# Patient Record
Sex: Female | Born: 1980 | Hispanic: Yes | Marital: Married | Smoking: Former smoker
Health system: Southern US, Community
[De-identification: ages and names within clinical notes are randomized; demographics above are authoritative.]

## PROBLEM LIST (undated history)

## (undated) DIAGNOSIS — F32A Depression, unspecified: Secondary | ICD-10-CM

## (undated) DIAGNOSIS — F329 Major depressive disorder, single episode, unspecified: Secondary | ICD-10-CM

## (undated) DIAGNOSIS — R51 Headache: Secondary | ICD-10-CM

## (undated) DIAGNOSIS — G709 Myoneural disorder, unspecified: Secondary | ICD-10-CM

## (undated) DIAGNOSIS — K219 Gastro-esophageal reflux disease without esophagitis: Secondary | ICD-10-CM

---

## 2008-03-25 HISTORY — PX: CHOLECYSTECTOMY: SHX55

## 2008-06-07 ENCOUNTER — Observation Stay: Payer: Self-pay

## 2008-06-07 ENCOUNTER — Emergency Department: Payer: Self-pay | Admitting: Internal Medicine

## 2008-06-10 ENCOUNTER — Observation Stay: Payer: Self-pay

## 2008-06-13 ENCOUNTER — Observation Stay: Payer: Self-pay | Admitting: Obstetrics and Gynecology

## 2008-06-16 ENCOUNTER — Inpatient Hospital Stay: Payer: Self-pay | Admitting: Obstetrics and Gynecology

## 2008-06-20 ENCOUNTER — Emergency Department: Payer: Self-pay | Admitting: Unknown Physician Specialty

## 2008-07-11 ENCOUNTER — Emergency Department: Payer: Self-pay | Admitting: Emergency Medicine

## 2008-08-09 ENCOUNTER — Ambulatory Visit: Payer: Self-pay | Admitting: Surgery

## 2008-08-12 ENCOUNTER — Ambulatory Visit: Payer: Self-pay | Admitting: Surgery

## 2009-11-28 IMAGING — US ABDOMEN ULTRASOUND LIMITED
1 series · 17 of 25 positions shown · non-contrast
Comparison: none

REASON FOR EXAM: abd pain
COMMENTS:

[Series 1: abdomen ultrasound limited · 17 of 32 slices shown]
[im 1/32]
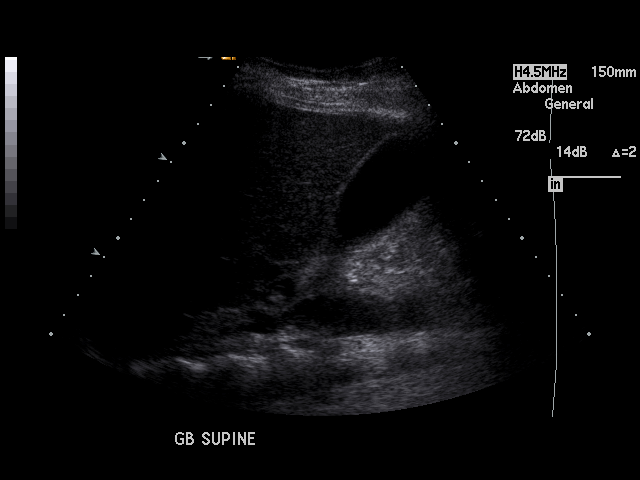
[im 3/32]
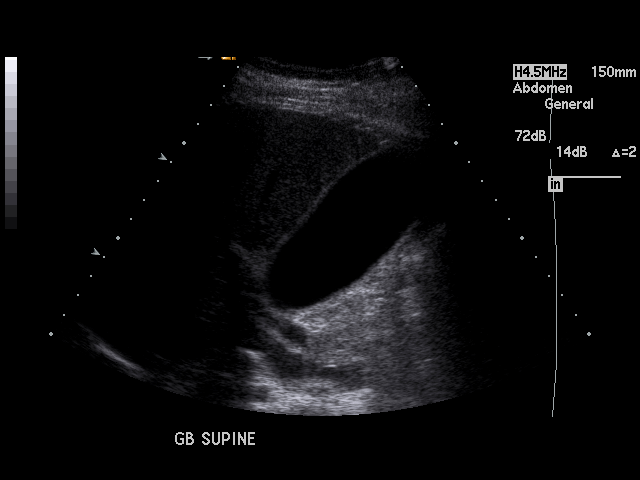
[im 4/32]
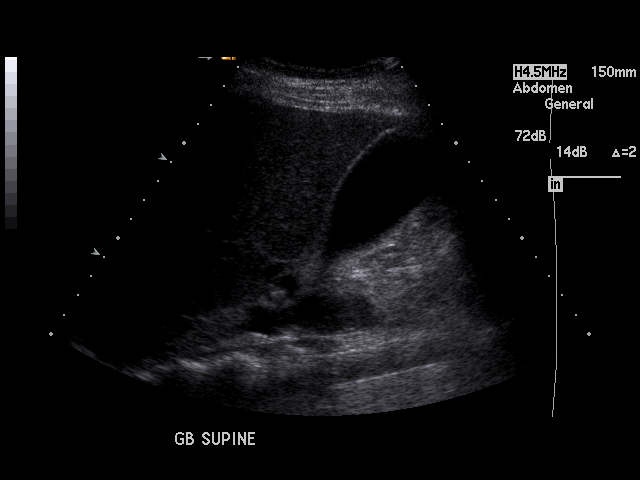
[im 7/32]
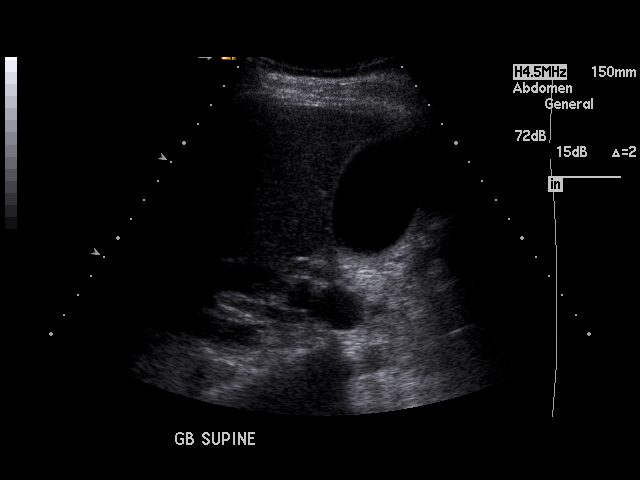
[im 8/32]
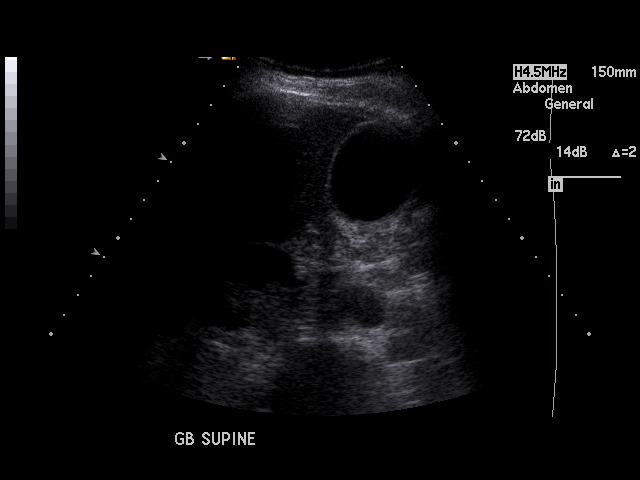
[im 11/32]
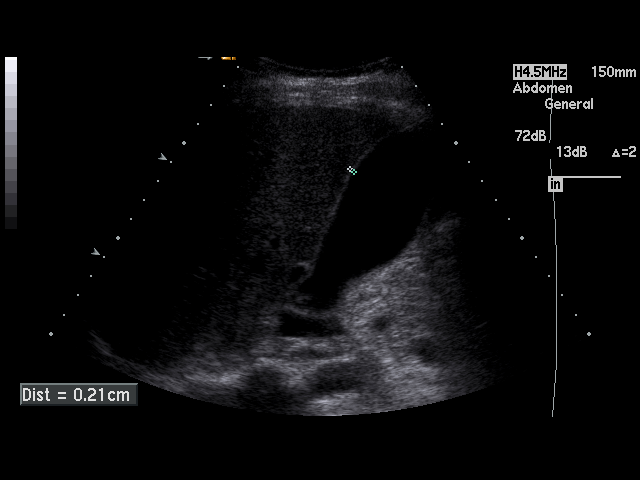
[im 12/32]
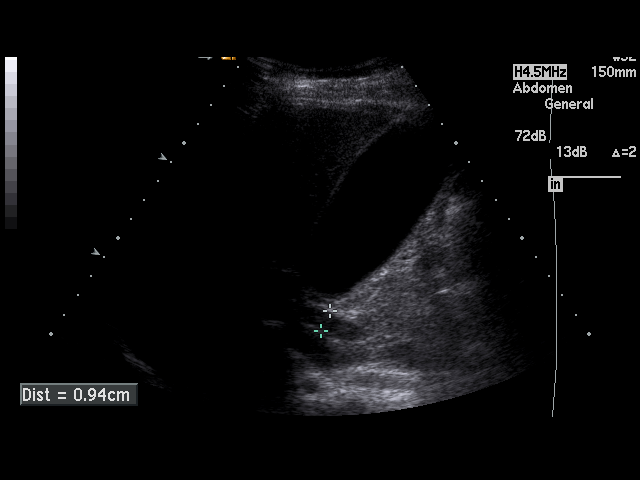
[im 15/32]
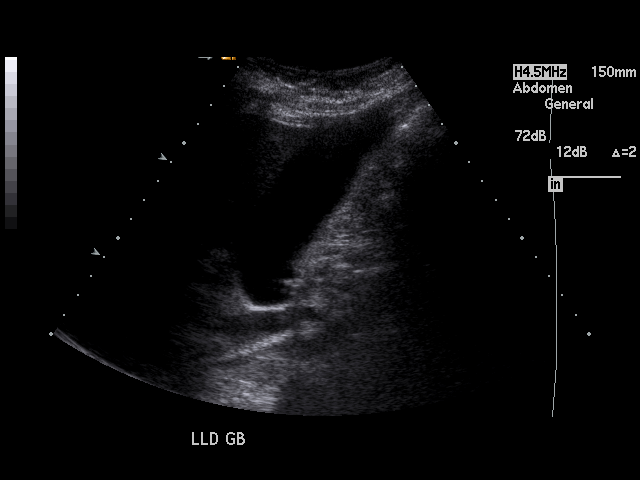
[im 16/32]
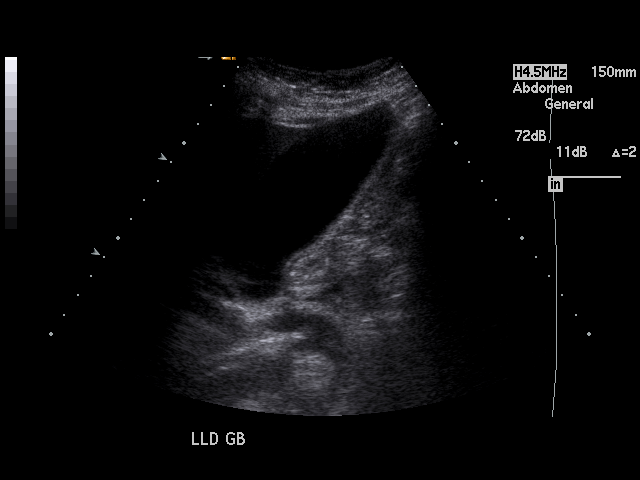
[im 17/32]
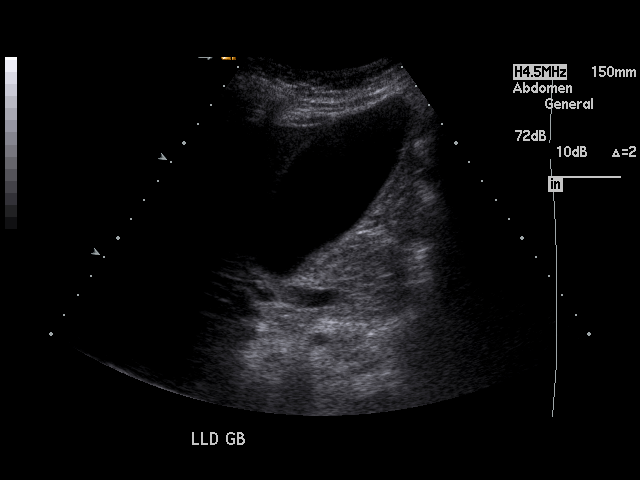
[im 20/32]
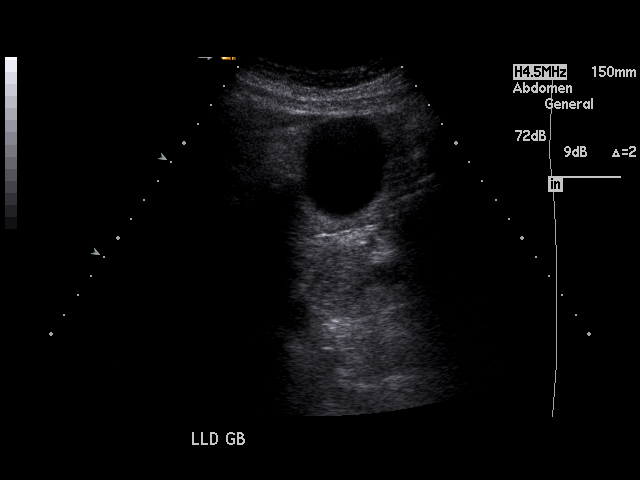
[im 21/32]
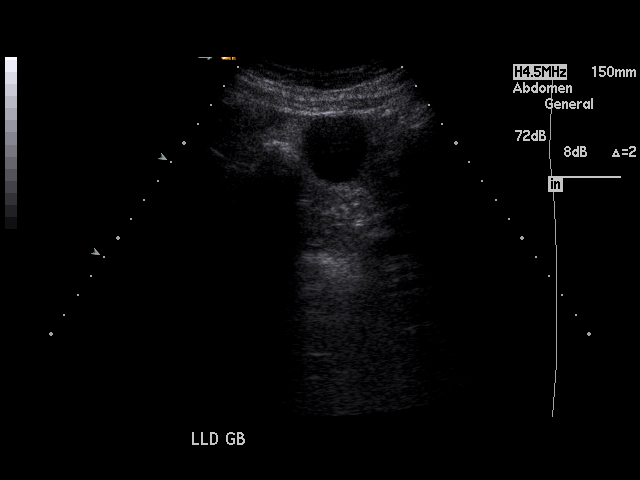
[im 24/32]
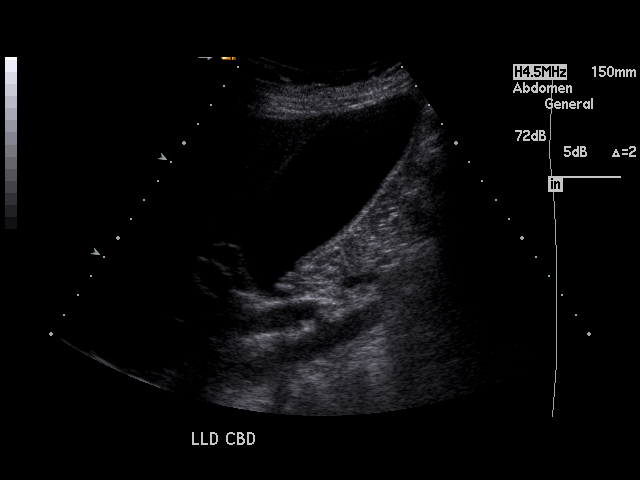
[im 25/32]
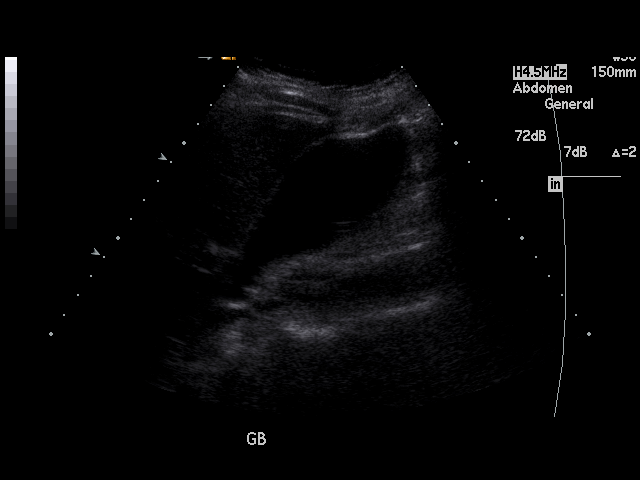
[im 28/32]
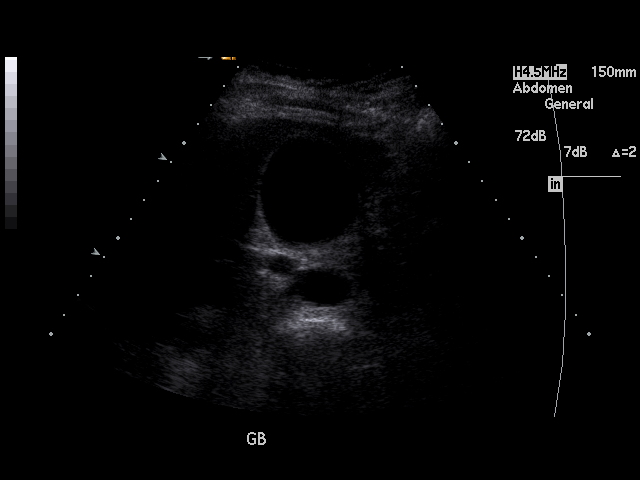
[im 29/32]
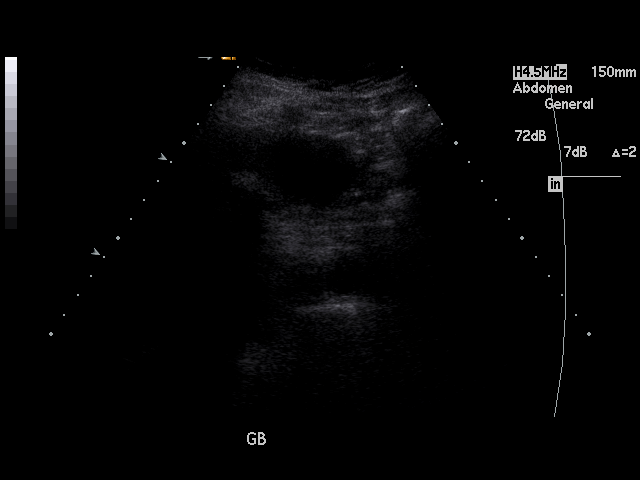
[im 32/32]
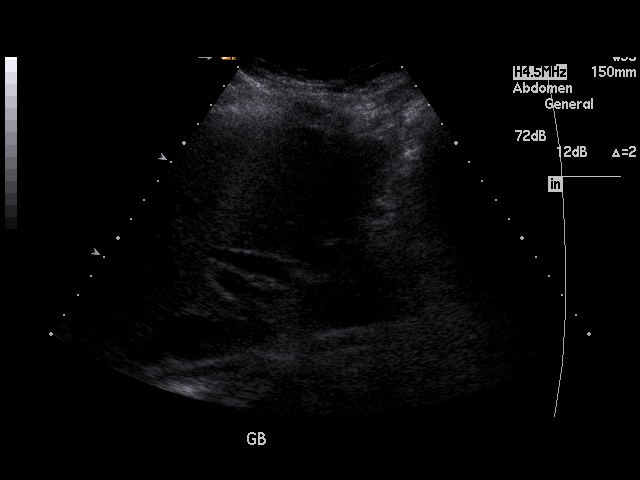

[17 of 25 positions shown; findings below may reference images not displayed]

PROCEDURE:     US  - US ABDOMEN LIMITED SURVEY  - June 20, 2008  [DATE]

RESULT:     The liver is grossly unremarkable. Evaluation of the gallbladder
demonstrates mobile gallstones with acoustic shadowing.  The gallbladder
wall measures 2.1 mm in diameter. There is no evidence of pericholecystic
fluid. There is no evidence of a sonographic Murphy's sign. The common bile
duct measures 9.4 mm in diameter.
IMPRESSION: 1.     No sonographic evidence of acute cholecystitis. The dilated common
bile duct may be secondary to a calculus previously within the biliary
system which has subsequently passed. There is no sonographic evidence of a
calculus in the present study. Mobile gallstones are identified within the
gallbladder.
2.     Dr. Syverson of the Emergency Department was informed of these findings
at the time of the initial interpretation.

## 2013-09-07 ENCOUNTER — Other Ambulatory Visit: Payer: Self-pay | Admitting: Neurosurgery

## 2013-09-08 ENCOUNTER — Encounter (HOSPITAL_COMMUNITY): Payer: Self-pay

## 2013-09-08 ENCOUNTER — Encounter (HOSPITAL_COMMUNITY)
Admission: RE | Admit: 2013-09-08 | Discharge: 2013-09-08 | Disposition: A | Discharge status: Home / Self Care | Payer: Worker's Compensation | Source: Ambulatory Visit | Attending: Neurosurgery | Admitting: Neurosurgery

## 2013-09-08 HISTORY — DX: Myoneural disorder, unspecified: G70.9

## 2013-09-08 HISTORY — DX: Major depressive disorder, single episode, unspecified: F32.9

## 2013-09-08 HISTORY — DX: Gastro-esophageal reflux disease without esophagitis: K21.9

## 2013-09-08 HISTORY — DX: Headache: R51

## 2013-09-08 HISTORY — DX: Depression, unspecified: F32.A

## 2013-09-08 LAB — SURGICAL PCR SCREEN
MRSA, PCR: NEGATIVE
STAPHYLOCOCCUS AUREUS: NEGATIVE

## 2013-09-08 LAB — BASIC METABOLIC PANEL
BUN: 10 mg/dL (ref 6–23)
CO2: 24 mEq/L (ref 19–32)
Calcium: 9.4 mg/dL (ref 8.4–10.5)
Chloride: 103 mEq/L (ref 96–112)
Creatinine, Ser: 0.44 mg/dL — ABNORMAL LOW (ref 0.50–1.10)
GFR calc Af Amer: >90 mL/min (ref >90)
Glucose, Bld: 102 mg/dL — ABNORMAL HIGH (ref 70–99)
POTASSIUM: 4.3 meq/L (ref 3.7–5.3)
SODIUM: 140 meq/L (ref 137–147)

## 2013-09-08 LAB — CBC
HCT: 40.9 % (ref 36.0–46.0)
Hemoglobin: 14.2 g/dL (ref 12.0–15.0)
MCH: 32.6 pg (ref 26.0–34.0)
MCHC: 34.7 g/dL (ref 30.0–36.0)
MCV: 93.8 fL (ref 78.0–100.0)
Platelets: 248 10*3/uL (ref 150–400)
RBC: 4.36 MIL/uL (ref 3.87–5.11)
RDW: 12 % (ref 11.5–15.5)
WBC: 7.4 10*3/uL (ref 4.0–10.5)

## 2013-09-08 LAB — HCG, SERUM, QUALITATIVE: PREG SERUM: NEGATIVE

## 2013-09-08 NOTE — Progress Notes (Signed)
Call to Darl PikesSusan, at Dr. Sueanne Margaritaabbell's office, she confirms surgery is on 09/10/2013, she also reports the pt. Was called & t she & the family are aware of the date & time for arrival. Call will be made to language resources to schedule interpretor.

## 2013-09-08 NOTE — Pre-Procedure Instructions (Addendum)
Tiffany Castillo  09/08/2013   Your procedure is scheduled on: Thursday, September 09, 2013  Report to [facility] (use Main Entrance "A'') at 10:00a.m. ( per MD)  Call this number if you have problems the morning of surgery: [phone]   Remember:   Do not eat food or drink liquids after midnight.   Take these medicines the morning of surgery with A SIP OF WATER: pain medicine is OK if she needs to take the morning of surgery   Stop taking Aspirin, Coumadin, Plavix, Effient, and herbal medications. Do not take any NSAIDs ie: Ibuprofen, Advil, Naproxen or any medication containing Aspirin.   Do not wear jewelry, make-up or nail polish.    Do not wear lotions, powders, or perfumes. You may wear deodorant.   Do not shave 48 hours prior to surgery.   Do not bring valuables to the hospital.  [facility] is not responsible for any belongings or valuables.                Contacts, dentures or bridgework may not be worn into surgery.   Leave suitcase in the car. After surgery it may be brought to your room.   For patients admitted to the hospital, discharge time is determined by your  treatment team.               Patients discharged the day of surgery will not be allowed to drive home.   Name and phone number of your driver: with husband   Special Instructions:  Special Instructions:Special Instructions: [place] - Preparing for Surgery  Before surgery, you can play an important role.  Because skin is not sterile, your skin needs to be as free of germs as possible.  You can reduce the number of germs on you skin by washing with CHG (chlorahexidine gluconate) soap before surgery.  CHG is an antiseptic cleaner which kills germs and bonds with the skin to continue killing germs even after washing.  Please DO NOT use if you have an allergy to CHG or antibacterial soaps.  If your skin becomes reddened/irritated stop using the CHG and inform your nurse when you arrive at  Short Stay.  Do not shave (including legs and underarms) for at least 48 hours prior to the first CHG shower.  You may shave your face.  Please follow these instructions carefully:   1.  Shower with CHG Soap the night before surgery and the morning of Surgery.  2.  If you choose to wash your hair, wash your hair first as usual with your normal shampoo.  3.  After you shampoo, rinse your hair and body thoroughly to remove the Shampoo.  4.  Use CHG as you would any other liquid soap.  You can apply chg directly  to the skin and wash gently with scrungie or a clean washcloth.  5.  Apply the CHG Soap to your body ONLY FROM THE NECK DOWN.  Do not use on open wounds or open sores.  Avoid contact with your eyes, ears, mouth and genitals (private parts).  Wash genitals (private parts) with your normal soap.  6.  Wash thoroughly, paying special attention to the area where your surgery will be performed.  7.  Thoroughly rinse your body with warm water from the neck down.  8.  DO NOT shower/wash with your normal soap after using and rinsing off the CHG Soap.  9.  Pat yourself dry with a clean towel.  10.  Wear clean pajamas.            11.  Place clean sheets on your bed the night of your first shower and do not sleep with pets.  Day of Surgery  Do not apply any lotions the morning of surgery.  Please wear clean clothes to the hospital/surgery center.   Please read over the following fact sheets that you were given: Pain Booklet, Coughing and Deep Breathing, MRSA Information and Surgical Site Infection Prevention

## 2013-09-08 NOTE — Progress Notes (Signed)
Calls 2 x's to Dr. Sueanne Margaritaabbell's office, spoke with Roxy HorsemanJessica, Susan out of office.  Surg. Date is in question, family & pt. Told that its 09/09/2013, chart says 09/10/2013. Spouse of pt. Will follow up with call to Dr. Sueanne Margaritaabbell's office Darl Pikes(Susan) this afternoon to get clarified. Language resources being used today for PAT visit, Dorene Grebeatalie. Spouse of pt. Speaks English, pt. Mostly spanish speaking. Pt. Denies pain in chest, no other symptoms of cardiac disease, no cardiac testing in her past. Pt. Had some PCP care in past- recent perhaps, but unsure where, [place] somewhere. Pt. Reports that she had implant in arm for Birth control, but expired since 03/2013. No menses in 3 yrs. Plus.

## 2013-09-09 MED ORDER — CEFAZOLIN SODIUM-DEXTROSE 2-3 GM-% IV SOLR
2.0000 g | INTRAVENOUS | Status: AC
  Administered 2013-09-10: 2 g via INTRAVENOUS
  Filled 2013-09-09: qty 50

## 2013-09-10 ENCOUNTER — Encounter (HOSPITAL_COMMUNITY): Payer: Self-pay | Admitting: Certified Registered"

## 2013-09-10 ENCOUNTER — Ambulatory Visit (HOSPITAL_COMMUNITY)
Admission: RE | Admit: 2013-09-10 | Discharge: 2013-09-11 | Disposition: A | Discharge status: Home / Self Care | Payer: Worker's Compensation | Source: Ambulatory Visit | Attending: Neurosurgery | Admitting: Neurosurgery

## 2013-09-10 ENCOUNTER — Ambulatory Visit (HOSPITAL_COMMUNITY): Payer: Worker's Compensation

## 2013-09-10 ENCOUNTER — Encounter (HOSPITAL_COMMUNITY): Payer: Worker's Compensation | Admitting: Certified Registered Nurse Anesthetist

## 2013-09-10 ENCOUNTER — Ambulatory Visit (HOSPITAL_COMMUNITY): Payer: Worker's Compensation | Admitting: Certified Registered Nurse Anesthetist

## 2013-09-10 ENCOUNTER — Encounter (HOSPITAL_COMMUNITY)
Admission: RE | Disposition: A | Discharge status: Home / Self Care | Payer: Self-pay | Source: Ambulatory Visit | Attending: Neurosurgery

## 2013-09-10 DIAGNOSIS — F3289 Other specified depressive episodes: Secondary | ICD-10-CM | POA: Insufficient documentation

## 2013-09-10 DIAGNOSIS — Z6826 Body mass index (BMI) 26.0-26.9, adult: Secondary | ICD-10-CM | POA: Insufficient documentation

## 2013-09-10 DIAGNOSIS — F329 Major depressive disorder, single episode, unspecified: Secondary | ICD-10-CM | POA: Insufficient documentation

## 2013-09-10 DIAGNOSIS — Z01812 Encounter for preprocedural laboratory examination: Secondary | ICD-10-CM | POA: Insufficient documentation

## 2013-09-10 DIAGNOSIS — K219 Gastro-esophageal reflux disease without esophagitis: Secondary | ICD-10-CM | POA: Insufficient documentation

## 2013-09-10 DIAGNOSIS — E669 Obesity, unspecified: Secondary | ICD-10-CM | POA: Insufficient documentation

## 2013-09-10 DIAGNOSIS — M5126 Other intervertebral disc displacement, lumbar region: Secondary | ICD-10-CM | POA: Diagnosis present

## 2013-09-10 DIAGNOSIS — Z01818 Encounter for other preprocedural examination: Secondary | ICD-10-CM | POA: Insufficient documentation

## 2013-09-10 HISTORY — PX: LUMBAR LAMINECTOMY/DECOMPRESSION MICRODISCECTOMY: SHX5026

## 2013-09-10 SURGERY — LUMBAR LAMINECTOMY/DECOMPRESSION MICRODISCECTOMY 1 LEVEL
Anesthesia: General | Site: Spine Lumbar | Laterality: Left

## 2013-09-10 MED ORDER — NEOSTIGMINE METHYLSULFATE 10 MG/10ML IV SOLN
INTRAVENOUS | Status: AC
  Filled 2013-09-10: qty 1

## 2013-09-10 MED ORDER — PHENOL 1.4 % MT LIQD: 1.0000 | OROMUCOSAL | Status: DC | PRN

## 2013-09-10 MED ORDER — ACETAMINOPHEN 650 MG RE SUPP: 650.0000 mg | RECTAL | Status: DC | PRN

## 2013-09-10 MED ORDER — 0.9 % SODIUM CHLORIDE (POUR BTL) OPTIME
TOPICAL | Status: DC | PRN
  Administered 2013-09-10: 1000 mL

## 2013-09-10 MED ORDER — ACETAMINOPHEN 325 MG PO TABS: 650.0000 mg | ORAL_TABLET | ORAL | Status: DC | PRN

## 2013-09-10 MED ORDER — MIDAZOLAM HCL 5 MG/5ML IJ SOLN
INTRAMUSCULAR | Status: DC | PRN
  Administered 2013-09-10: 2 mg via INTRAVENOUS

## 2013-09-10 MED ORDER — KETOROLAC TROMETHAMINE 30 MG/ML IJ SOLN
30.0000 mg | Freq: Four times a day (QID) | INTRAMUSCULAR | Status: DC
  Administered 2013-09-10 – 2013-09-11 (×3): 30 mg via INTRAVENOUS
  Filled 2013-09-10 (×7): qty 1

## 2013-09-10 MED ORDER — POLYETHYLENE GLYCOL 3350 17 G PO PACK
17.0000 g | PACK | Freq: Every day | ORAL | Status: DC | PRN
  Filled 2013-09-10: qty 1

## 2013-09-10 MED ORDER — ONDANSETRON HCL 4 MG/2ML IJ SOLN
INTRAMUSCULAR | Status: AC
  Filled 2013-09-10: qty 2

## 2013-09-10 MED ORDER — POTASSIUM CHLORIDE IN NACL 20-0.9 MEQ/L-% IV SOLN
INTRAVENOUS | Status: DC
  Filled 2013-09-10 (×3): qty 1000

## 2013-09-10 MED ORDER — OXYCODONE-ACETAMINOPHEN 5-325 MG PO TABS
1.0000 | ORAL_TABLET | ORAL | Status: DC | PRN
  Administered 2013-09-11: 2 via ORAL
  Filled 2013-09-10: qty 2

## 2013-09-10 MED ORDER — MIDAZOLAM HCL 2 MG/2ML IJ SOLN
INTRAMUSCULAR | Status: AC
  Filled 2013-09-10: qty 2

## 2013-09-10 MED ORDER — MORPHINE SULFATE 2 MG/ML IJ SOLN: 1.0000 mg | INTRAMUSCULAR | Status: DC | PRN

## 2013-09-10 MED ORDER — MENTHOL 3 MG MT LOZG: 1.0000 | LOZENGE | OROMUCOSAL | Status: DC | PRN

## 2013-09-10 MED ORDER — HYDROCODONE-ACETAMINOPHEN 5-325 MG PO TABS: 1.0000 | ORAL_TABLET | ORAL | Status: DC | PRN

## 2013-09-10 MED ORDER — LACTATED RINGERS IV SOLN
INTRAVENOUS | Status: DC
  Administered 2013-09-10: 09:00:00 via INTRAVENOUS

## 2013-09-10 MED ORDER — THROMBIN 5000 UNITS EX SOLR
CUTANEOUS | Status: DC | PRN
  Administered 2013-09-10 (×2): 5000 [IU] via TOPICAL

## 2013-09-10 MED ORDER — SODIUM CHLORIDE 0.9 % IJ SOLN
3.0000 mL | Freq: Two times a day (BID) | INTRAMUSCULAR | Status: DC
  Administered 2013-09-10 (×2): 3 mL via INTRAVENOUS

## 2013-09-10 MED ORDER — HYDROMORPHONE HCL PF 1 MG/ML IJ SOLN
0.2500 mg | INTRAMUSCULAR | Status: DC | PRN
  Administered 2013-09-10: 12:00:00 via INTRAVENOUS

## 2013-09-10 MED ORDER — ROCURONIUM BROMIDE 100 MG/10ML IV SOLN
INTRAVENOUS | Status: DC | PRN
  Administered 2013-09-10: 50 mg via INTRAVENOUS

## 2013-09-10 MED ORDER — SODIUM CHLORIDE 0.9 % IJ SOLN: 3.0000 mL | INTRAMUSCULAR | Status: DC | PRN

## 2013-09-10 MED ORDER — ONDANSETRON HCL 4 MG/2ML IJ SOLN
INTRAMUSCULAR | Status: DC | PRN
  Administered 2013-09-10: 4 mg via INTRAVENOUS

## 2013-09-10 MED ORDER — PROPOFOL 10 MG/ML IV BOLUS
INTRAVENOUS | Status: AC
  Filled 2013-09-10: qty 20

## 2013-09-10 MED ORDER — GLYCOPYRROLATE 0.2 MG/ML IJ SOLN
INTRAMUSCULAR | Status: AC
  Filled 2013-09-10: qty 3

## 2013-09-10 MED ORDER — FENTANYL CITRATE 0.05 MG/ML IJ SOLN
INTRAMUSCULAR | Status: AC
  Filled 2013-09-10: qty 5

## 2013-09-10 MED ORDER — HYDROMORPHONE HCL PF 1 MG/ML IJ SOLN
INTRAMUSCULAR | Status: AC
  Filled 2013-09-10: qty 1

## 2013-09-10 MED ORDER — LIDOCAINE HCL (CARDIAC) 20 MG/ML IV SOLN
INTRAVENOUS | Status: AC
  Filled 2013-09-10: qty 5

## 2013-09-10 MED ORDER — OXYCODONE HCL 5 MG/5ML PO SOLN: 5.0000 mg | Freq: Once | ORAL | Status: AC | PRN

## 2013-09-10 MED ORDER — LIDOCAINE HCL (CARDIAC) 20 MG/ML IV SOLN
INTRAVENOUS | Status: DC | PRN
  Administered 2013-09-10: 180 mg via INTRAVENOUS

## 2013-09-10 MED ORDER — CYCLOBENZAPRINE HCL 10 MG PO TABS: 10.0000 mg | ORAL_TABLET | Freq: Three times a day (TID) | ORAL | Status: DC | PRN

## 2013-09-10 MED ORDER — HEMOSTATIC AGENTS (NO CHARGE) OPTIME
TOPICAL | Status: DC | PRN
  Administered 2013-09-10: 1 via TOPICAL

## 2013-09-10 MED ORDER — FENTANYL CITRATE 0.05 MG/ML IJ SOLN
INTRAMUSCULAR | Status: DC | PRN
  Administered 2013-09-10: 100 ug via INTRAVENOUS
  Administered 2013-09-10 (×2): 25 ug via INTRAVENOUS
  Administered 2013-09-10 (×2): 50 ug via INTRAVENOUS

## 2013-09-10 MED ORDER — NEOSTIGMINE METHYLSULFATE 10 MG/10ML IV SOLN
INTRAVENOUS | Status: DC | PRN
  Administered 2013-09-10: 4 mg via INTRAVENOUS

## 2013-09-10 MED ORDER — ROCURONIUM BROMIDE 50 MG/5ML IV SOLN
INTRAVENOUS | Status: AC
  Filled 2013-09-10: qty 1

## 2013-09-10 MED ORDER — GLYCOPYRROLATE 0.2 MG/ML IJ SOLN
INTRAMUSCULAR | Status: DC | PRN
  Administered 2013-09-10: .6 mg via INTRAVENOUS

## 2013-09-10 MED ORDER — FAMOTIDINE 20 MG PO TABS
20.0000 mg | ORAL_TABLET | Freq: Every day | ORAL | Status: DC
  Administered 2013-09-10: 20 mg via ORAL
  Filled 2013-09-10 (×2): qty 1

## 2013-09-10 MED ORDER — OXYCODONE HCL 5 MG PO TABS
5.0000 mg | ORAL_TABLET | Freq: Once | ORAL | Status: AC | PRN
  Administered 2013-09-10: 5 mg via ORAL

## 2013-09-10 MED ORDER — SENNA 8.6 MG PO TABS
1.0000 | ORAL_TABLET | Freq: Two times a day (BID) | ORAL | Status: DC
  Administered 2013-09-10: 8.6 mg via ORAL
  Filled 2013-09-10 (×3): qty 1

## 2013-09-10 MED ORDER — OXYCODONE HCL 5 MG PO TABS
ORAL_TABLET | ORAL | Status: AC
  Filled 2013-09-10: qty 1

## 2013-09-10 MED ORDER — LACTATED RINGERS IV SOLN
INTRAVENOUS | Status: DC | PRN
  Administered 2013-09-10 (×2): via INTRAVENOUS

## 2013-09-10 MED ORDER — LIDOCAINE-EPINEPHRINE 0.5 %-1:200000 IJ SOLN
INTRAMUSCULAR | Status: DC | PRN
  Administered 2013-09-10: 10 mL

## 2013-09-10 MED ORDER — HYDROCODONE-ACETAMINOPHEN 5-325 MG PO TABS: 1.0000 | ORAL_TABLET | Freq: Four times a day (QID) | ORAL | Status: DC | PRN

## 2013-09-10 MED ORDER — PROPOFOL 10 MG/ML IV BOLUS
INTRAVENOUS | Status: DC | PRN
  Administered 2013-09-10: 200 mg via INTRAVENOUS

## 2013-09-10 MED ORDER — ONDANSETRON HCL 4 MG/2ML IJ SOLN: 4.0000 mg | Freq: Four times a day (QID) | INTRAMUSCULAR | Status: DC | PRN

## 2013-09-10 MED ORDER — DIAZEPAM 5 MG PO TABS
5.0000 mg | ORAL_TABLET | Freq: Four times a day (QID) | ORAL | Status: DC | PRN
  Administered 2013-09-10: 5 mg via ORAL
  Filled 2013-09-10: qty 1

## 2013-09-10 MED ORDER — ONDANSETRON HCL 4 MG/2ML IJ SOLN
4.0000 mg | INTRAMUSCULAR | Status: DC | PRN
  Administered 2013-09-10: 4 mg via INTRAVENOUS
  Filled 2013-09-10: qty 2

## 2013-09-10 SURGICAL SUPPLY — 58 items
BAG DECANTER FOR FLEXI CONT (MISCELLANEOUS) IMPLANT
BENZOIN TINCTURE PRP APPL 2/3 (GAUZE/BANDAGES/DRESSINGS) IMPLANT
BLADE 10 SAFETY STRL DISP (BLADE) IMPLANT
BLADE SURG ROTATE 9660 (MISCELLANEOUS) IMPLANT
BUR MATCHSTICK NEURO 3.0 LAGG (BURR) ×3 IMPLANT
CANISTER SUCT 3000ML (MISCELLANEOUS) ×3 IMPLANT
CLOSURE WOUND 1/2 X4 (GAUZE/BANDAGES/DRESSINGS)
CONT SPEC 4OZ CLIKSEAL STRL BL (MISCELLANEOUS) ×3 IMPLANT
DECANTER SPIKE VIAL GLASS SM (MISCELLANEOUS) ×3 IMPLANT
DERMABOND ADHESIVE PROPEN (GAUZE/BANDAGES/DRESSINGS) ×2
DERMABOND ADVANCED (GAUZE/BANDAGES/DRESSINGS)
DERMABOND ADVANCED .7 DNX12 (GAUZE/BANDAGES/DRESSINGS) IMPLANT
DERMABOND ADVANCED .7 DNX6 (GAUZE/BANDAGES/DRESSINGS) ×1 IMPLANT
DRAPE LAPAROTOMY 100X72X124 (DRAPES) ×3 IMPLANT
DRAPE MICROSCOPE LEICA (MISCELLANEOUS) ×3 IMPLANT
DRAPE POUCH INSTRU U-SHP 10X18 (DRAPES) ×3 IMPLANT
DRAPE SURG 17X23 STRL (DRAPES) ×3 IMPLANT
DURAPREP 26ML APPLICATOR (WOUND CARE) ×3 IMPLANT
ELECT REM PT RETURN 9FT ADLT (ELECTROSURGICAL) ×3
ELECTRODE REM PT RTRN 9FT ADLT (ELECTROSURGICAL) ×1 IMPLANT
GAUZE SPONGE 4X4 16PLY XRAY LF (GAUZE/BANDAGES/DRESSINGS) IMPLANT
GLOVE BIOGEL PI IND STRL 7.0 (GLOVE) ×2 IMPLANT
GLOVE BIOGEL PI IND STRL 7.5 (GLOVE) ×1 IMPLANT
GLOVE BIOGEL PI INDICATOR 7.0 (GLOVE) ×4
GLOVE BIOGEL PI INDICATOR 7.5 (GLOVE) ×2
GLOVE ECLIPSE 6.5 STRL STRAW (GLOVE) ×3 IMPLANT
GLOVE ECLIPSE 7.0 STRL STRAW (GLOVE) ×3 IMPLANT
GLOVE EXAM NITRILE LRG STRL (GLOVE) IMPLANT
GLOVE EXAM NITRILE MD LF STRL (GLOVE) IMPLANT
GLOVE EXAM NITRILE XL STR (GLOVE) IMPLANT
GLOVE EXAM NITRILE XS STR PU (GLOVE) IMPLANT
GLOVE SS BIOGEL STRL SZ 6.5 (GLOVE) ×3 IMPLANT
GLOVE SUPERSENSE BIOGEL SZ 6.5 (GLOVE) ×6
GOWN STRL REUS W/ TWL LRG LVL3 (GOWN DISPOSABLE) ×3 IMPLANT
GOWN STRL REUS W/ TWL XL LVL3 (GOWN DISPOSABLE) IMPLANT
GOWN STRL REUS W/TWL 2XL LVL3 (GOWN DISPOSABLE) IMPLANT
GOWN STRL REUS W/TWL LRG LVL3 (GOWN DISPOSABLE) ×6
GOWN STRL REUS W/TWL XL LVL3 (GOWN DISPOSABLE)
KIT BASIN OR (CUSTOM PROCEDURE TRAY) ×3 IMPLANT
KIT ROOM TURNOVER OR (KITS) ×3 IMPLANT
NEEDLE HYPO 25X1 1.5 SAFETY (NEEDLE) ×3 IMPLANT
NEEDLE SPNL 18GX3.5 QUINCKE PK (NEEDLE) ×3 IMPLANT
NS IRRIG 1000ML POUR BTL (IV SOLUTION) ×3 IMPLANT
PACK LAMINECTOMY NEURO (CUSTOM PROCEDURE TRAY) ×3 IMPLANT
PAD ARMBOARD 7.5X6 YLW CONV (MISCELLANEOUS) ×15 IMPLANT
RUBBERBAND STERILE (MISCELLANEOUS) ×6 IMPLANT
SPONGE GAUZE 4X4 12PLY (GAUZE/BANDAGES/DRESSINGS) IMPLANT
SPONGE LAP 4X18 X RAY DECT (DISPOSABLE) IMPLANT
SPONGE SURGIFOAM ABS GEL SZ50 (HEMOSTASIS) ×3 IMPLANT
STRIP CLOSURE SKIN 1/2X4 (GAUZE/BANDAGES/DRESSINGS) IMPLANT
SUT VIC AB 0 CT1 18XCR BRD8 (SUTURE) ×1 IMPLANT
SUT VIC AB 0 CT1 8-18 (SUTURE) ×2
SUT VIC AB 2-0 CT1 18 (SUTURE) ×3 IMPLANT
SUT VIC AB 3-0 SH 8-18 (SUTURE) ×3 IMPLANT
SYR 20ML ECCENTRIC (SYRINGE) ×3 IMPLANT
TOWEL OR 17X24 6PK STRL BLUE (TOWEL DISPOSABLE) ×3 IMPLANT
TOWEL OR 17X26 10 PK STRL BLUE (TOWEL DISPOSABLE) ×3 IMPLANT
WATER STERILE IRR 1000ML POUR (IV SOLUTION) ×3 IMPLANT

## 2013-09-10 NOTE — Anesthesia Preprocedure Evaluation (Addendum)
Anesthesia Evaluation  Patient identified by MRN, date of birth, ID band Patient awake    Reviewed: Allergy & Precautions, H&P , NPO status , Patient's Chart, lab work & pertinent test results  Airway Mallampati: II  Neck ROM: full    Dental  (+) Dental Advisory Given, Partial Upper, Partial Lower   Pulmonary neg pulmonary ROS,          Cardiovascular negative cardio ROS      Neuro/Psych  Headaches, Depression  Neuromuscular disease    GI/Hepatic GERD-  ,  Endo/Other  obese  Renal/GU      Musculoskeletal   Abdominal   Peds  Hematology   Anesthesia Other Findings   Reproductive/Obstetrics                          Anesthesia Physical Anesthesia Plan  ASA: II  Anesthesia Plan: General   Post-op Pain Management:    Induction: Intravenous  Airway Management Planned: Oral ETT  Additional Equipment:   Intra-op Plan:   Post-operative Plan: Extubation in OR  Informed Consent: I have reviewed the patients History and Physical, chart, labs and discussed the procedure including the risks, benefits and alternatives for the proposed anesthesia with the patient or authorized representative who has indicated his/her understanding and acceptance.     Plan Discussed with: CRNA, Anesthesiologist and Surgeon  Anesthesia Plan Comments:         Anesthesia Quick Evaluation

## 2013-09-10 NOTE — Transfer of Care (Signed)
Immediate Anesthesia Transfer of Care Note  Patient: Tiffany Castillo  Procedure(s) Performed: Procedure(s) with comments: LUMBAR LAMINECTOMY/DECOMPRESSION MICRODISCECTOMY LEFT LUMBAR FIVE-SACRAL-ONE (Left) - left  Patient Location: PACU  Anesthesia Type:General  Level of Consciousness: awake and alert   Airway & Oxygen Therapy: Patient Spontanous Breathing and Patient connected to nasal cannula oxygen  Post-op Assessment: Report given to PACU RN and Post -op Vital signs reviewed and stable  Post vital signs: Reviewed and stable  Complications: No apparent anesthesia complications

## 2013-09-10 NOTE — Op Note (Signed)
09/10/2013  12:07 PM  PATIENT:  Tiffany Castillo  33 y.o. 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 was taken to the operating room, intubated and placed under a general anesthetic without difficulty. She was positioned prone on a Wilson frame with all pressure points padded. Her back was prepped and draped in a sterile manner. I opened the skin with a 10 blade and carried the dissection down to the thoracolumbar fascia. I used both sharp dissection and the monopolar cautery to expose the lamina of L5, and S1. I confirmed my location with an intraoperative xray.  I used the drill, Kerrison punches, and curettes to perform a semihemilaminectomy of L5. I used the punches to remove the ligamentum flavum to expose the thecal sac. I brought the microscope into the operative field and with Dr.Nundkumar's assistance we started our decompression of the spinal canal, thecal sac and S1 root(s). I cauterized epidural veins overlying the disc space then divided them sharply. I opened the disc space with a 15 blade and proceeded with the discectomy. I used pituitary rongeurs, curettes, and other instruments to remove disc material. After the discectomy was completed we inspected the S1 nerve root and felt it was well decompressed. I explored rostrally, laterally, medially, and caudally and was satisfied with the decompression. I irrigated the wound, then closed  in layers. I approximated the thoracolumbar fascia, subcutaneous, and subcuticular planes with vicryl sutures. I used dermabond for a sterile dressing.   PLAN OF CARE: Admit for overnight observation  PATIENT DISPOSITION:  PACU - hemodynamically stable.   Delay start of Pharmacological VTE agent (>24hrs) due to surgical blood loss or risk of bleeding:  yes

## 2013-09-10 NOTE — Discharge Instructions (Addendum)
Lumbar Discectomy °Care After °A discectomy involves removal of discmaterial (the cartilage-like structures located between the bones of the back). It is done to relieve pressure on nerve roots. It can be used as a treatment for a back problem. The time in surgery depends on the findings in surgery and what is necessary to correct the problems. °HOME CARE INSTRUCTIONS  °· Check the cut (incision) made by the surgeon twice a day for signs of infection. Some signs of infection may include:  °· A foul smelling, greenish or yellowish discharge from the wound.  °· Increased pain.  °· Increased redness over the incision (operative) site.  °· The skin edges may separate.  °· Flu-like symptoms (problems).  °· A temperature above 101.5° F (38.6° C).  °· Change your bandages in about 24 to 36 hours following surgery or as directed.  °· You may shower tomrrow.  Avoid bathtubs, swimming pools and hot tubs for three weeks or until your incision has healed completely. °· Follow your doctor's instructions as to safe activities, exercises, and physical therapy.  °· Weight reduction may be beneficial if you are overweight.  °· Daily exercise is helpful to prevent the return of problems. Walking is permitted. You may use a treadmill without an incline. Cut down on activities and exercise if you have discomfort. You may also go up and down stairs as much as you can tolerate.  °· DO NOT lift anything heavier than 10 to 15 lbs. Avoid bending or twisting at the waist. Always bend your knees when lifting.  °· Maintain strength and range of motion as instructed.  °· Do not drive for 10 days, or as directed by your doctors. You may be a passenger . Lying back in the passenger seat may be more comfortable for you. Always wear a seatbelt.  °· Limit your sitting in a regular chair to 20 to 30 minutes at a time. There are no limitations for sitting in a recliner. You should lie down or walk in between sitting periods.  °· Only take  over-the-counter or prescription medicines for pain, discomfort, or fever as directed by your caregiver.  °SEEK MEDICAL CARE IF:  °· There is increased bleeding (more than a small spot) from the wound.  °· You notice redness, swelling, or increasing pain in the wound.  °· Pus is coming from wound.  °· You develop an unexplained oral temperature above 102° F (38.9° C) develops.  °· You notice a foul smell coming from the wound or dressing.  °· You have increasing pain in your wound.  °SEEK IMMEDIATE MEDICAL CARE IF:  °· You develop a rash.  °· You have difficulty breathing.  °· You develop any allergic problems to medicines given.  °Document Released: 02/14/2004 Document Revised: 02/28/2011 Document Reviewed: 06/04/2007 °ExitCare® Patient Information °

## 2013-09-10 NOTE — Discharge Summary (Signed)
  Admitting diagnosis: HNP left L5/S1 Discharge diagnosis: same Discharge destination: Home Meds:Hydrocodone Surgeon: Coletta Memosabbell, Kyle Exam:alert, oriented, moving all extremities well Wound is clean, dry, and without signs of infection.  Procedure: Left L5/S1 discetomy.

## 2013-09-10 NOTE — Plan of Care (Signed)
Problem: Consults Goal: Diagnosis - Spinal Surgery Outcome: Completed/Met Date Met:  09/10/13 Lumbar Laminectomy (Complex)

## 2013-09-10 NOTE — H&P (Signed)
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 She was at work when this occurred and the pain has only been on the left side. No discomfort only on the left side. She never had pain like this in the past. Pain radiates to the thigh, calf, and into the foot along the outside. She has had no bowel or bladder incontinence.  REVIEW OF SYSTEMS: Positive for back pain, leg pain, leg weakness, arthritis, leg pain with walking. She denies constitutional, eye, ear, nose, throat, respiratory, gastrointestinal, genitourinary, skin, neurological, psychiatric, endocrine, hematologic or allergic problems. On the pain chart, she lists pain across the lower back and in the left leg. She states pain started while she is working sometime around 2.45 a.m. 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 PAST MEDICAL HISTORY:   Current Medical Conditions: Past medical history, I believe is otherwise good.  Prior Operations: She had an IBU procedure in 2011.  Medications and Allergies: She has no known drug allergies. Medications were ibuprofen, prednisone taper, and tizanidine. FAMILY HISTORY: Mother and father both are in good health. SOCIAL HISTORY: She does not smoke. She does not use alcohol. She does not use illicit drugs. PHYSICAL EXAMINATION: Her husband was the interpreter, seemed to be fine. Tiffany Castillo is unable to answer all questions in [place]. Vital signs: Height 66 inches, weight 163 pounds. BMI is 26.31. Blood pressure 109/64, pulse is 83, respiratory rate is 20. Temperature is 99. On examination, alert and oriented x4, and answers all questions appropriately. Obvious distress. Lying on the exam table off to the side, with the left side down. Limps markedly with antalgic gait, was unable to stand up straight,  unwilling. She has to sit in such a manner that she does not put much of any weight on her left side. She is constantly rubbing if she is bent over. On examination, she has 5/5 strength. Normal muscle tone, bulk, and coordination. Reflexes are 2+ at the knees. 1+ at the left ankle, 2+ at the right ankle. Normal in the upper extremities. Normal muscle tone, bulk, and coordination. Speech is clear. Speech is fluent. Pupils equal, round, and reactive to light. Full extraocular movements. Full visual fields. Hearing intact to voice. Symmetric facies. Symmetric facial sensation. Uvula elevates in midline. Shoulder shrug is normal. Tongue protrudes in the midline. Normal muscle tone and bulk. Coordination is relatively normal. DIAGNOSTIC IMAGING: MRI shows a large disc herniation on the left side at L5-S1, rest of the spine is good. Paraspinous soft tissues. Cauda equina is normal. Conus medullaris is normal. PLAN: I have advised Tiffany Castillo and her husband to undergo operative decompression. With the great deal of discomfort that she has and at this time she looks miserable, I think an operation is the best way to decompress or to relieve the nerve root. They are going to think about this tonight and give the office a call tomorrow, but I am prepared to do it on either Thursday or Friday, if they call up. Risks and benefits: Bleeding, infection, no relief, need for further surgery, disc recurrence of about 15% chance of the disc recurrence, damage to the bowel and bladder, possible bowel and bladder incontinence, weakness in the foot, in the left foot, nerve damage  all were discussed along with other risks. She understands and will contact me.

## 2013-09-10 NOTE — Anesthesia Procedure Notes (Signed)
Procedure Name: Intubation Date/Time: 09/10/2013 10:04 AM Performed by: Armandina GemmaMIRARCHI, ANGELA Pre-anesthesia Checklist: Patient identified, Timeout performed, Emergency Drugs available, Suction available and Patient being monitored Patient Re-evaluated:Patient Re-evaluated prior to inductionOxygen Delivery Method: Circle system utilized Preoxygenation: Pre-oxygenation with 100% oxygen Intubation Type: IV induction Ventilation: Mask ventilation without difficulty Laryngoscope Size: Miller and 2 Grade View: Grade I Tube type: Oral Tube size: 7.0 mm Number of attempts: 1 Airway Equipment and Method: Stylet Placement Confirmation: ETT inserted through vocal cords under direct vision,  breath sounds checked- equal and bilateral and positive ETCO2 Secured at: 21 cm Tube secured with: Tape Dental Injury: Teeth and Oropharynx as per pre-operative assessment  Comments: IV induction Hodierne- intubation AM CRNA atraumatic- pt reports upper loose teeth in preop holding area- ? Permanent upper partial- teeth and mouth as preop- + Bs = Hodierne

## 2013-09-11 NOTE — Progress Notes (Signed)
  Pt. Alert and oriented,follows simple instructions, denies pain. Incision area without swelling, redness or S/S of infection. Voiding adequate clear yellow urine. Moving all extremities well and vitals stable and documented. Patient discharged home with spouse. Lumbar surgery notes instructions given to patient and family member for home safety and precautions. Pt. and family stated understanding of instructions given.  

## 2013-09-12 ENCOUNTER — Encounter (HOSPITAL_COMMUNITY): Payer: Self-pay | Admitting: Neurosurgery

## 2013-09-13 NOTE — Anesthesia Postprocedure Evaluation (Signed)
Anesthesia Post Note  Patient: Tiffany Castillo  Procedure(s) Performed: Procedure(s) (LRB): LUMBAR LAMINECTOMY/DECOMPRESSION MICRODISCECTOMY LEFT LUMBAR FIVE-SACRAL-ONE (Left)  Anesthesia type: General  Patient location: PACU  Post pain: Pain level controlled and Adequate analgesia  Post assessment: Post-op Vital signs reviewed, Patient's Cardiovascular Status Stable, Respiratory Function Stable, Patent Airway and Pain level controlled  Last Vitals:  Filed Vitals:   09/11/13 0700  BP: 94/59  Pulse: 69  Temp: 37.4 C  Resp: 18    Post vital signs: Reviewed and stable  Level of consciousness: awake, alert  and oriented  Complications: No apparent anesthesia complications

## 2015-07-25 DIAGNOSIS — M5442 Lumbago with sciatica, left side: Secondary | ICD-10-CM | POA: Insufficient documentation

## 2015-07-25 DIAGNOSIS — F329 Major depressive disorder, single episode, unspecified: Secondary | ICD-10-CM | POA: Insufficient documentation

## 2015-07-25 NOTE — ED Notes (Signed)
Pt in with co lower back pain hx of back surgery 2015 and since she delivered 3 months ago has had worsening pain.  Has MRI scheduled for tomorrow but states cannot tolerate pain.

## 2015-07-26 ENCOUNTER — Emergency Department
Admission: EM | Admit: 2015-07-26 | Discharge: 2015-07-26 | Disposition: A | Discharge status: Home / Self Care | Payer: Self-pay | Attending: Emergency Medicine | Admitting: Emergency Medicine

## 2015-07-26 ENCOUNTER — Encounter: Payer: Self-pay | Admitting: Emergency Medicine

## 2015-07-26 DIAGNOSIS — M5442 Lumbago with sciatica, left side: Secondary | ICD-10-CM

## 2015-07-26 MED ORDER — MELOXICAM 15 MG PO TABS: 15.0000 mg | ORAL_TABLET | Freq: Every day | ORAL | Status: DC

## 2015-07-26 MED ORDER — KETOROLAC TROMETHAMINE 60 MG/2ML IM SOLN
60.0000 mg | Freq: Once | INTRAMUSCULAR | Status: AC
  Administered 2015-07-26: 60 mg via INTRAMUSCULAR
  Filled 2015-07-26: qty 2

## 2015-07-26 MED ORDER — LIDOCAINE 5 % EX PTCH
1.0000 | MEDICATED_PATCH | CUTANEOUS | Status: DC
  Administered 2015-07-26: 1 via TRANSDERMAL
  Filled 2015-07-26 (×2): qty 1

## 2015-07-26 MED ORDER — LIDOCAINE 5 % EX PTCH: 1.0000 | MEDICATED_PATCH | Freq: Two times a day (BID) | CUTANEOUS | Status: AC

## 2015-07-26 NOTE — ED Provider Notes (Signed)
[facility] Emergency Department Provider Note   ____________________________________________  Time seen: Approximately 103 AM  I have reviewed the triage vital signs and the nursing notes.   HISTORY  Chief Complaint Back Pain    HPI Tiffany Castillo is a 35 y.o. female comes into the hospital today with some back pain. The patient reports that 2 years ago she had surgery on her L5. The patient reports that she had her baby 3 months ago and she has been having pain in her leg and buttock cheek since then. She reports that the baby was born on February 11. She has been using warm compresses and ibuprofen for her pain but it has not helped. The pain did not start until 2 weeks after she had the baby. It wasn't that bad initially but has been getting worse. The patient has an MRI scheduled tomorrow. She has seen the doctor at her job for [employer] and he has not given her any medications. She reports that she's been doing a lot of lifting of her baby especially in the car seat. She rates her pain as a 10 out of 10 in intensity. She saw the doctor on April 4 but reports that the pain was too bad tonight. She denies problems with urination or problems with bowel movements. The patient reports that her left leg feels weak. She is driving herself today.   Past Medical History  Diagnosis Date  . 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 GERD (gastroesophageal reflux disease)     uses Rx q day    . Headache(784.0)     x1 week, reports that its bad- migraine like when she is laying down   . Neuromuscular disorder [facility])     hnp    Patient Active Problem List   Diagnosis Date Noted  . HNP (herniated nucleus pulposus), lumbar 09/10/2013    Past Surgical History  Procedure Laterality Date  . Cesarean section      /w epidural  . Vaginal birth after cesarean section  2012  . Cholecystectomy  2010    done at  [facility]  . Lumbar laminectomy/decompression  microdiscectomy Left 09/10/2013    Procedure: LUMBAR LAMINECTOMY/DECOMPRESSION MICRODISCECTOMY LEFT LUMBAR FIVE-SACRAL-ONE;  Surgeon: Carmela Hurt, MD;  Location: [facility];  Service: Neurosurgery;  Laterality: Left;  left    Current Outpatient Rx  Name  Route  Sig  Dispense  Refill  . ibuprofen (ADVIL,MOTRIN) 200 MG tablet   Oral   Take 200 mg by mouth every 6 (six) hours as needed.         . Menthol, Topical Analgesic, (ICY HOT EX)   Apply externally   Apply 1 patch topically daily as needed (for pain).         . Prenatal Multivit-Min-Fe-FA (PRENATAL #2 PO)   Oral   Take 1 tablet by mouth daily.         Marland Kitchen lidocaine (LIDODERM) 5 %   Transdermal   Place 1 patch onto the skin every 12 (twelve) hours. Remove & Discard patch within 12 hours or as directed by MD   10 patch   0   . meloxicam (MOBIC) 15 MG tablet   Oral   Take 1 tablet (15 mg total) by mouth daily.   20 tablet   0     Allergies Review of patient's allergies indicates no known allergies.  History reviewed. No pertinent family history.  Social History Social History  Substance Use Topics  . Smoking status: Never Smoker   . Smokeless tobacco: None  . Alcohol Use: No     Comment: for social only- on a rare occasion     Review of Systems Constitutional: No fever/chills Eyes: No visual changes. ENT: No sore throat. Cardiovascular: Denies chest pain. Respiratory: Denies shortness of breath. Gastrointestinal: No abdominal pain.  No nausea, no vomiting.  No diarrhea.  No constipation. Genitourinary: Negative for dysuria. Musculoskeletal: back pain. Skin: Negative for rash. Neurological: Negative for headaches, focal weakness or numbness.  10-point ROS otherwise negative.  ____________________________________________   PHYSICAL EXAM:  VITAL SIGNS: ED Triage Vitals  Enc Vitals Group     BP 07/25/15 2307 129/92 mmHg     Pulse Rate 07/25/15 2306 102     Resp 07/25/15 2306 18     Temp  07/25/15 2306 98.5 F (36.9 C)     Temp Source 07/25/15 2306 Oral     SpO2 07/25/15 2306 98 %     Weight 07/25/15 2306 155 lb (70.308 kg)     Height --      Head Cir --      Peak Flow --      Pain Score 07/25/15 2307 10     Pain Loc --      Pain Edu? --      Excl. in GC? --     Constitutional: Alert and oriented. Well appearing and in Moderate distress. Eyes: Conjunctivae are normal. PERRL. EOMI. Head: Atraumatic. Nose: No congestion/rhinnorhea. Mouth/Throat: Mucous membranes are moist.  Oropharynx non-erythematous. Cardiovascular: Normal rate, regular rhythm. Grossly normal heart sounds.  Good peripheral circulation. Respiratory: Normal respiratory effort.  No retractions. Lungs CTAB. Gastrointestinal: Soft and nontender. No distention. Positive bowel sounds Musculoskeletal: No midline tenderness to palpation of the patient's lumbar spine. She has pain at her left sacroiliac joint.  Neurologic:  Normal speech and language. Skin:  Skin is warm, dry and intact.  Psychiatric: Mood and affect are normal.   ____________________________________________   LABS (all labs ordered are listed, but only abnormal results are displayed)  Labs Reviewed - No data to display ____________________________________________  EKG  None ____________________________________________  RADIOLOGY  None ____________________________________________   PROCEDURES  Procedure(s) performed: None  Critical Care performed: No  ____________________________________________   INITIAL IMPRESSION / ASSESSMENT AND PLAN / ED COURSE  Pertinent labs & imaging results that were available during my care of the patient were reviewed by me and considered in my medical decision making (see chart for details).  This is a 35 year old female who comes into the hospital today with some back pain. The patient has an appointment to have an MRI done tomorrow. As she is driving I'm unable to give her any sedating  medications I will give her a dose of Toradol IM and place a Lidoderm patch to the area. She'll be reassessed afterwards. I will not do any imaging at this time since the patient has an MRI already scheduled for tomorrow.  The patient's pain is improved after using the lidocaine patch and the Toradol shot. The patient be discharged home to receive her MRI and follow-up as she has scheduled on Monday. ____________________________________________   FINAL CLINICAL IMPRESSION(S) / ED DIAGNOSES  Final diagnoses:  Left-sided low back pain with left-sided sciatica      NEW MEDICATIONS STARTED DURING THIS VISIT:  New Prescriptions   LIDOCAINE (LIDODERM) 5 %    Place 1 patch onto the skin every 12 (twelve) hours. Remove &  Discard patch within 12 hours or as directed by MD   MELOXICAM (MOBIC) 15 MG TABLET    Take 1 tablet (15 mg total) by mouth daily.     Note:  This document was prepared using Dragon voice recognition software and may include unintentional dictation errors.    Rebecka Apley, MD 07/26/15 [phone]

## 2015-07-26 NOTE — Discharge Instructions (Signed)
Citica  (Sciatica)  La citica es Conservation officer, historic buildings, debilidad, entumecimiento u hormigueo a lo largo del nervio citico. El nervio comienza en la zona inferior de la espalda y desciende por la parte posterior de cada pierna. El nervio controla los msculos de la parte inferior de la pierna y de la zona posterior de la rodilla, y transmite la sensibilidad a la parte posterior del muslo, la pierna y la planta del pie. La citica es un sntoma de otras afecciones mdicas. Por ejemplo, un dao a los nervios o algunas enfermedades como un disco herniado o un espoln seo en la columna vertebral, podran daarle o presionar en el nervio citico. Esto causa dolor, debilidad y otras sensaciones normalmente asociadas con la citica. Generalmente la citica afecta slo un lado del cuerpo. CAUSAS   Disco herniado o desplazado.  Enfermedad degenerativa del disco.  Un sndrome doloroso que compromete un msculo angosto de los glteos (sndrome piriforme).  Lesin o fractura plvica.  Embarazo.  Tumor (casos raros). SNTOMAS  Los sntomas pueden variar de leves a muy graves. Por lo general, los sntomas descienden desde la zona lumbar a las nalgas y la parte posterior de la pierna. Ellos son:   Hormigueo leve o dolor sordo en la parte inferior de la espalda, la pierna o la cadera.  Adormecimiento en la parte posterior de la pantorrilla o la planta del pie.  Sensacin de [employer] zona lumbar, la pierna o la cadera.  Dolor agudo en la zona inferior de la espalda, la pierna o la cadera.  Debilidad en las piernas.  Dolor de espalda intenso que [employer] movimientos. Los sntomas pueden empeorar al toser, Brewing technologist, rer o estar sentado o parado durante [employer]. Adems, el sobrepeso puede empeorar los sntomas.  DIAGNSTICO  Su mdico le har un examen fsico para buscar los sntomas comunes de la citica. Le pedir que haga algunos movimientos o actividades que activaran el dolor del nervio  citico. Para encontrar las causas de la citica podr indicarle otros estudios. Estos pueden ser:   Anlisis de Ignacio.  Radiografas.  Pruebas de diagnstico por imgenes, como resonancia magntica o tomografa computada. TRATAMIENTO  El tratamiento se dirige a las causas de la citica. A veces, el tratamiento no es necesario, y Conservation officer, historic buildings y Health and safety inspector desaparecen por s mismos. Si necesita tratamiento, su mdico puede sugerir:   Medicamentos de venta libre para Best boy.  Medicamentos recetados, como antiinflamatorios, relajantes musculares o narcticos.  Aplicacin de calor o hielo en la zona del dolor.  Inyecciones de corticoides para disminuir el dolor, la irritacin y la inflamacin alrededor del nervio.  Reduccin de la [employer] perodos de [place].  Ejercicios y estiramiento del abdomen para fortalecer y Teacher, English as a foreign language la flexibilidad de la columna vertebral. Su mdico puede sugerirle perder peso si el peso extra empeora el dolor de espalda.  Fisioterapia.  La ciruga para eliminar lo que presiona o pincha el nervio, como un espoln seo o parte de una hernia de disco. INSTRUCCIONES PARA EL CUIDADO EN EL HOGAR   Slo tome medicamentos de venta libre o recetados para Glass blower/designer o Health and safety inspector, segn las indicaciones de su mdico.  Aplique hielo sobre el rea dolorida durante 20 minutos 3-4 veces por da durante los primeras 48-72 horas. Luego intente aplicar calor de la misma manera.  Haga ejercicios, elongue o realice sus actividades habituales, si no le causan ms dolor.  Cumpla con todas las sesiones de fisioterapia, segn le  indique su mdico.  Cumpla con todas las visitas de control, segn le indique su mdico.  No use tacones altos o zapatos que no tengan buen apoyo.  Verifique que el colchn no sea muy blando. Un colchn firme Engineer, materialsaliviar el dolor y las [place] Springsmolestias. SOLICITE ATENCIN MDICA DE INMEDIATO SI:   Pierde el control de la vejiga o del intestino  (incontinencia).  Aumenta la debilidad en la zona inferior de la espalda, la pelvis, las nalgas o las piernas.  Siente irritacin o inflamacin en la espalda.  Tiene sensacin de ardor al ConocoPhillipsorinar.  El dolor empeora cuando se acuesta o lo despierta por la noche.  El dolor es peor del que experiment en el pasado.  Dura ms de 4 semanas.  Pierde peso sin motivo de Ardmoremanera sbita. ASEGRESE DE QUE:   Comprende estas instrucciones.  Controlar su enfermedad.  Solicitar ayuda de inmediato si no mejora o si empeora.   Esta informacin no tiene Theme park managercomo fin reemplazar el consejo del mdico. Asegrese de hacerle al mdico cualquier pregunta que tenga.   Document Released: 03/11/2005 Document Revised: 11/30/2014 Elsevier Interactive Patient Education 2016 [employer]  Dolor Radicular (Radicular Pain) El dolor radicular est causado en la irritacin de las races Cedarvillenerviosas. Generalmente la causa es una degeneracin el un disco de la columna vertebral. Esto produce dolor que se siente en el brazo o la pierna. Entre otras causas del dolor radicular se incluyen:  Nurse, children'sracturas  Enfermedades cardacas  Cncer  Neuropatas (un estado anormal y a menudo degenerativo del sistema nervioso o nervios). Para el diagnstico le indicarn una tomografa computarizada o imgenes por resonancia magntica para determinar la causa primaria. Los nervios que comienzan en el cuello (races nerviosas) pueden Doctor, general practiceocasionar dolor radicular en la parte exterior del hombro y en el brazo. ste puede extenderse hacia el pulgar y los dedos. Los sntomas varan segn la raz nerviosa afectada. En la mayor parte de los casos mejora en gran medida con un tratamiento conservador. Para los problemas en el cuello le indicarn fisioterapia, un collar o una traccin cervical. El tratamiento puede durar varias semanas y se considerar la posibilidad de una ciruga si los sntomas no mejoran.  El tratamiento conservador tambin se  recomienda para los casos de citica que causan dolor que se irradia desde la zona baja de la espalda o nalgas hacia la pierna o el pie. Generalmente hay una historia previa de problemas en la espalda. La mayora de los pacientes mejora completamente luego de 2 a 4 semanas de reposo en cama y otros tratamientos de apoyo. El reposo en cama reduce la presin en el disco considerablemente. El estar sentado no es una buena posicin, ya que aumenta la presin sobre el disco. Evite encorvarse, levantar mucho peso, permanecer sentado por Con-waymucho tiempo, y las actividades que puedan empeorar el problema. El los casos ms graves se Magazine features editorrealizar una traccin. La ciruga est reservada para los [address] Msbpacientes que no mejoran dentro de los primeros meses de Bremondtratamiento. Utilice los medicamentos de venta libre o de prescripcin para Chief Technology Officerel dolor, Environmental health practitionerel malestar o la Grand Riverfiebre, segn se lo indique el profesional que lo asiste. Los narcticos y los relajantes musculares ayudan a Teacher, early years/precalmar el dolor ms intenso y los espasmos, proporcionando una sedacin Benningtonsuave. Tressie StalkerUna terapia de fro o Gutierrezburymasajes tambin le brindarn Sligoalivio. No se recomienda la manipulacin de la columna vertebral. Esto puede aumentar el grado de protrusin del disco. Las inyecciones epidurales con esteroides son a menudo un tratamiento efectivo para el dolor radicular.  Esas inyecciones colocan medicamentos al nervio espinal en el espacio entre la cubierta protectora de la mdula espinal y los huesos de la espalda (vertebrae). El profesional que lo asiste podr darle ms informacin acerca de las inyecciones de esteroides. Estas inyecciones son ms efectivas cuando se administran dentro de las Marsh & McLennandos semanas de la aparicin del Engineer, miningdolor.  Comunquese con su mdico para realizar un control segn las indicaciones. Como parte importante del tratamiento deber seguir un programa de rehabilitacin, con ejercicios de elongacin y fortalecimiento.  SOLICITE ATENCIN MDICA DE INMEDIATO SI:  Presenta  siente debilidad o adormecimiento inusual en sus brazos o piernas.  Presenta prdida del control del intestino o de la vejiga.  Presenta dolor abdominal.   Esta informacin no tiene como fin reemplazar el consejo del mdico. Asegrese de hacerle al mdico cualquier pregunta que tenga.   Document Released: 03/11/2005 Document Revised: 06/03/2011 Elsevier Interactive Patient Education [employer]2016 Elsevier Inc.

## 2015-07-26 NOTE — ED Notes (Signed)
Pharmacy notified to send lidocaine patch, spoke with [facility], states will send.

## 2018-10-20 ENCOUNTER — Emergency Department
Admission: EM | Admit: 2018-10-20 | Discharge: 2018-10-20 | Disposition: A | Discharge status: Home / Self Care | Payer: Self-pay | Attending: Emergency Medicine | Admitting: Emergency Medicine

## 2018-10-20 ENCOUNTER — Other Ambulatory Visit: Payer: Self-pay

## 2018-10-20 ENCOUNTER — Encounter: Payer: Self-pay | Admitting: Emergency Medicine

## 2018-10-20 DIAGNOSIS — L509 Urticaria, unspecified: Secondary | ICD-10-CM | POA: Insufficient documentation

## 2018-10-20 DIAGNOSIS — T783XXA Angioneurotic edema, initial encounter: Secondary | ICD-10-CM | POA: Insufficient documentation

## 2018-10-20 MED ORDER — FAMOTIDINE 20 MG PO TABS: 20.0000 mg | ORAL_TABLET | Freq: Two times a day (BID) | ORAL | 0 refills | Status: AC

## 2018-10-20 MED ORDER — PREDNISONE 10 MG PO TABS: ORAL_TABLET | ORAL | 0 refills | Status: AC

## 2018-10-20 MED ORDER — FAMOTIDINE 20 MG PO TABS
20.0000 mg | ORAL_TABLET | Freq: Once | ORAL | Status: AC
  Administered 2018-10-20: 09:00:00 20 mg via ORAL
  Filled 2018-10-20: qty 1

## 2018-10-20 MED ORDER — DEXAMETHASONE SODIUM PHOSPHATE 10 MG/ML IJ SOLN
10.0000 mg | Freq: Once | INTRAMUSCULAR | Status: AC
  Administered 2018-10-20: 10 mg via INTRAMUSCULAR
  Filled 2018-10-20: qty 1

## 2018-10-20 NOTE — ED Provider Notes (Signed)
[facility] Emergency Department Provider Note  ____________________________________________   First MD Initiated Contact with Patient 10/20/18 0831     (approximate)  I have reviewed the triage vital signs and the nursing notes.   HISTORY  Chief Complaint Allergic Reaction   HPI Tiffany Castillo is a 38 y.o. female presents to the ED with complaint of itching for approximately 3 weeks.  Patient is unaware of any allergens and denies any previous allergy problems.  Patient states that she has been taking Benadryl 25 mg twice a day for the last 2 weeks with minimal improvement.  Patient states that the itching comes and goes.  She denies any difficulty breathing or swallowing.     Past Medical History:  Diagnosis Date  . 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 GERD (gastroesophageal reflux disease)    uses Rx q day    . Headache(784.0)    x1 week, reports that its bad- migraine like when she is laying down   . Neuromuscular disorder [facility])    hnp    Patient Active Problem List   Diagnosis Date Noted  . HNP (herniated nucleus pulposus), lumbar 09/10/2013    Past Surgical History:  Procedure Laterality Date  . CESAREAN SECTION     /w epidural  . CHOLECYSTECTOMY  2010   done at [place]  . LUMBAR LAMINECTOMY/DECOMPRESSION MICRODISCECTOMY Left 09/10/2013   Procedure: LUMBAR LAMINECTOMY/DECOMPRESSION MICRODISCECTOMY LEFT LUMBAR FIVE-SACRAL-ONE;  Surgeon: Winfield Cunas, MD;  Location: [place];  Service: Neurosurgery;  Laterality: Left;  left  . VAGINAL BIRTH AFTER CESAREAN SECTION  2012    Prior to Admission medications   Medication Sig Start Date End Date Taking? Authorizing Provider  famotidine (PEPCID) 20 MG tablet Take 1 tablet (20 mg total) by mouth 2 (two) times daily. 10/20/18 10/20/19  Johnn Hai, PA-C  Menthol, Topical Analgesic, (ICY HOT EX) Apply 1 patch topically daily as needed (for pain).    [provider]   predniSONE (DELTASONE) 10 MG tablet Take 6 tablets today, on day 2 take 5 tablets, day 3 take 4 tablets, day 4 take 3 tablets, day 5 take  2 tablets and 1 tablet the last day 10/20/18   Johnn Hai, PA-C    Allergies Patient has no known allergies.  No family history on file.  Social History Social History   Tobacco Use  . Smoking status: Never Smoker  . Smokeless tobacco: Never Used  Substance Use Topics  . Alcohol use: No    Comment: for social only- on a rare occasion   . Drug use: No    Review of Systems Constitutional: No fever/chills Cardiovascular: Denies chest pain. Respiratory: Denies shortness of breath. Gastrointestinal: No abdominal pain.  No nausea, no vomiting.  Musculoskeletal: Negative for muscle aches. Skin: Positive for intermittent rash.  Positive for edema upper lip. Neurological: Negative for headaches, focal weakness or numbness. ___________________________________________   PHYSICAL EXAM:  VITAL SIGNS: ED Triage Vitals  Enc Vitals Group     BP 10/20/18 0820 (!) 135/99     Pulse Rate 10/20/18 0820 75     Resp 10/20/18 0820 16     Temp 10/20/18 0820 98.5 F (36.9 C)     Temp Source 10/20/18 0820 Oral     SpO2 10/20/18 0820 100 %     Weight 10/20/18 0818 154 lb 15.7 oz (70.3 kg)     Height --      Head Circumference --  Peak Flow --      Pain Score 10/20/18 0818 0     Pain Loc --      Pain Edu? --      Excl. in GC? --    Constitutional: Alert and oriented. Well appearing and in no acute distress. Eyes: Conjunctivae are normal.  Head: Atraumatic. Nose: No congestion/rhinnorhea. Mouth/Throat: Mucous membranes are moist.  Oropharynx non-erythematous.  No edema present posteriorly.  Upper lip is edematous and nontender.  The patient is able to swallow secretions without any difficulty. Neck: No stridor.   Hematological/Lymphatic/Immunilogical: No cervical lymphadenopathy. Cardiovascular: Normal rate, regular rhythm. Grossly normal  heart sounds.  Good peripheral circulation. Respiratory: Normal respiratory effort.  No retractions. Lungs CTAB. Musculoskeletal: Moves upper and lower extremities that any difficulty.  Normal gait was noted. Neurologic:  Normal speech and language. No gross focal neurologic deficits are appreciated. No gait instability. Skin:  Skin is warm, dry and intact.  At present no actual urticarial rash is noted. Psychiatric: Mood and affect are normal. Speech and behavior are normal.  ____________________________________________   LABS (all labs ordered are listed, but only abnormal results are displayed)  Labs Reviewed - No data to display  PROCEDURES  Procedure(s) performed (including Critical Care):  Procedures   ____________________________________________   INITIAL IMPRESSION / ASSESSMENT AND PLAN / ED COURSE  As part of my medical decision making, I reviewed the following data within the electronic MEDICAL RECORD NUMBER Notes from prior ED visits and [place] Controlled Substance Database  38 year old female presents to the ED with complaint of itching for approximately [redacted] weeks along with some edema to her upper lip.  She denies any difficulty breathing or swallowing.  She denies any previous allergy problems.  She has been taking Benadryl 25 mg twice a day for the last 2 weeks and occasionally Claritin.  On exam today there is no rash noted but she does have angioedema upper lip.  She was given Decadron and Pepcid while in the ED.  There was some noticeable improvement after 1 hour and patient states she is less itchy.  We will continue with the same medication with a Decadron Dosepak, Pepcid 20 mg twice daily and to continue with Benadryl as needed for itching.  She is to follow-up with her PCP or [facility] if any continued problems.  She was given strict precautions to return to the ED if she become short of breath or having difficulty breathing.   ____________________________________________   FINAL CLINICAL IMPRESSION(S) / ED DIAGNOSES  Final diagnoses:  Angioedema, initial encounter  Urticaria     ED Discharge Orders         Ordered    famotidine (PEPCID) 20 MG tablet  2 times daily     10/20/18 0847    predniSONE (DELTASONE) 10 MG tablet     10/20/18 0847           Note:  This document was prepared using Dragon voice recognition software and may include unintentional dictation errors.    Tommi RumpsSummers, Jozlynn Plaia L, PA-C 10/20/18 1037    Minna AntisPaduchowski, Kevin, MD 10/20/18 1359

## 2018-10-20 NOTE — ED Triage Notes (Signed)
C/O itching to body for several weeks.  Worse last night.  AAOx3.  Skin warm and dry. NAD.  Voice clear and strong.

## 2018-10-20 NOTE — Discharge Instructions (Signed)
Follow-up with your primary care provider or can no clinic acute care if any continued problems.  Continue taking the Benadryl with your medication.  You may take 1 or 2 every 6 hours as needed for itching.  Be aware that this medication could cause drowsiness especially if you are taking 2 tablets at a time.  Begin taking prednisone today and taper as we discussed.  Pepcid is 1 tablet twice a day.  If this does not improve or reoccurs you may need to need to see your primary care provider or a allergy specialist.  If any worsening of your symptoms or difficulty breathing return to the emergency room immediately.

## 2018-10-20 NOTE — ED Notes (Signed)
See triage note  States she developed itching about 2 -3 weeks ago  Has been using Benadryl every other day and also been taking Claritin   States her whole body itches   Also noticed some swelling to upper lip  No diff swallowing or breathing

## 2019-01-08 ENCOUNTER — Other Ambulatory Visit: Payer: Self-pay

## 2019-01-13 ENCOUNTER — Ambulatory Visit
Admission: RE | Admit: 2019-01-13 | Discharge: 2019-01-13 | Disposition: A | Discharge status: Home / Self Care | Payer: Self-pay | Source: Ambulatory Visit | Attending: Oncology | Admitting: Oncology

## 2019-01-13 ENCOUNTER — Ambulatory Visit (INDEPENDENT_AMBULATORY_CARE_PROVIDER_SITE_OTHER): Payer: Self-pay | Admitting: Obstetrics and Gynecology

## 2019-01-13 ENCOUNTER — Ambulatory Visit: Payer: Self-pay | Attending: Oncology | Admitting: *Deleted

## 2019-01-13 ENCOUNTER — Other Ambulatory Visit: Payer: Self-pay

## 2019-01-13 ENCOUNTER — Encounter: Payer: Self-pay | Admitting: *Deleted

## 2019-01-13 ENCOUNTER — Encounter: Payer: Self-pay | Admitting: Obstetrics and Gynecology

## 2019-01-13 VITALS — BP 127/82 | HR 63 | Temp 98.3°F | Ht 62.0 in | Wt 169.0 lb

## 2019-01-13 VITALS — BP 119/81 | HR 63 | Ht 61.02 in | Wt 169.5 lb

## 2019-01-13 DIAGNOSIS — Z0000 Encounter for general adult medical examination without abnormal findings: Secondary | ICD-10-CM | POA: Insufficient documentation

## 2019-01-13 DIAGNOSIS — R87610 Atypical squamous cells of undetermined significance on cytologic smear of cervix (ASC-US): Secondary | ICD-10-CM

## 2019-01-13 DIAGNOSIS — B977 Papillomavirus as the cause of diseases classified elsewhere: Secondary | ICD-10-CM

## 2019-01-13 DIAGNOSIS — N72 Inflammatory disease of cervix uteri: Secondary | ICD-10-CM

## 2019-01-13 NOTE — Progress Notes (Signed)
Referring Provider:  [facility]  HPI:  Tiffany Castillo is a 38 y.o.  No obstetric history on file.  who presents today for evaluation and management of abnormal cervical cytology.  Patient denies any previous history of Pap abnormality.  Dysplasia History:  ASCUS   Positive HPV ROS:  Pertinent items are noted in HPI.  OB History  No obstetric history on file.    Past Medical History:  Diagnosis Date  . 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 GERD (gastroesophageal reflux disease)    uses Rx q day    . Headache(784.0)    x1 week, reports that its bad- migraine like when she is laying down   . Neuromuscular disorder ([place])    hnp    Past Surgical History:  Procedure Laterality Date  . CESAREAN SECTION     /w epidural  . CHOLECYSTECTOMY  2010   done at [facility]  . LUMBAR LAMINECTOMY/DECOMPRESSION MICRODISCECTOMY Left 09/10/2013   Procedure: LUMBAR LAMINECTOMY/DECOMPRESSION MICRODISCECTOMY LEFT LUMBAR FIVE-SACRAL-ONE;  Surgeon: Winfield Cunas, MD;  Location: [place] [facility];  Service: Neurosurgery;  Laterality: Left;  left  . VAGINAL BIRTH AFTER CESAREAN SECTION  2012    SOCIAL HISTORY: Social History   Substance and Sexual Activity  Alcohol Use No   Comment: for social only- on a rare occasion    Social History   Substance and Sexual Activity  Drug Use No     History reviewed. No pertinent family history.  ALLERGIES:  Patient has no known allergies.  She has a current medication list which includes the following prescription(s): famotidine, loratadine, menthol (topical analgesic), and prednisone.  Physical Exam: -Vitals:  BP 119/81   Pulse 63   Ht 5' 1.02" (1.55 m)   Wt 169 lb 8 oz (76.9 kg)   BMI 32.00 kg/m  GEN: WD, WN, NAD.  A+ O x 3, good mood and affect. ABD:  NT, ND.  Soft, no masses.  No hernias noted.  PROCEDURE: 1.  Urine Pregnancy Test:  not done 2.  Colposcopy performed with 4% acetic acid after verbal consent obtained               -Aceto-white Lesions Location(s): NONE.              -Biopsy performed at None               -ECC indicated and performed: Yes.       -Biopsy sites made hemostatic with pressure and Monsel's solution   -Satisfactory colposcopy: No.    -Evidence of Invasive cervical CA :  NO  ASSESSMENT:  Tiffany Castillo is a 38 y.o. No obstetric history on file. here for No diagnosis found.Marland Kitchen  PLAN: 1.  I discussed the grading system of pap smears and HPV high risk viral types.  We will discuss management after colpo results return.  No orders of the defined types were placed in this encounter.          F/U  Return in about 2 weeks (around 01/27/2019) for Colpo f/u.  Jeannie Fend ,MD 01/13/2019,10:46 AM

## 2019-01-13 NOTE — Progress Notes (Signed)
Patient comes in today for colposcopy from the [facility] program.

## 2019-01-13 NOTE — Progress Notes (Signed)
  Subjective:     Patient ID: Aanvi Voyles, female   DOB: [birth year], 38 y.o.   MRN: [id number]  HPI   Review of Systems     Objective:   Physical Exam Chest:     Breasts:        Right: Tenderness present. No swelling, bleeding, inverted nipple, mass, nipple discharge or skin change.        Left: No swelling, bleeding, inverted nipple, mass, nipple discharge, skin change or tenderness.       Comments: Tenderness right breast - drinks "a lot of caffeine" Lymphadenopathy:     Upper Body:     Right upper body: No supraclavicular or axillary adenopathy.     Left upper body: No supraclavicular adenopathy.        Assessment:     38 year old Hispanic female referred to [place] by [facility] for abnormal pap of HPV positive ASCUS and baseline mammogram.  Lloyda, the interpreter present during the interview and exam.  Left breast with greater glandular like tissue than the right.  The right breast is tender to palpation.  Patient states she drinks "a lot of caffeine."  Encouraged to decrease caffeine intake.  Taught self breast awareness.  Reviewed abnormal pap results and need for a colposcopy and possible biopsy.  Patient has been screened for eligibility.  She does not have any insurance, Medicare or Medicaid.  She also meets financial eligibility.  Hand-out given on the Affordable Care Act. Risk Assessment    Risk Scores      01/13/2019   Last edited by: Theodore Demark, RN   5-year risk: 0.4 %   Lifetime risk: 8.6 %            Plan:     Baseline screening mammogram ordered.  Patient has an appointment today with Dr. Amalia Hailey for colposcopy and biopsy.  Will follow up per [facility] protocol.

## 2019-01-14 ENCOUNTER — Other Ambulatory Visit: Payer: Self-pay | Admitting: Obstetrics and Gynecology

## 2019-01-25 LAB — ANATOMIC PATHOLOGY REPORT: PDF Image: 0

## 2019-01-27 ENCOUNTER — Other Ambulatory Visit: Payer: Self-pay

## 2019-01-27 ENCOUNTER — Encounter: Payer: Self-pay | Admitting: Obstetrics and Gynecology

## 2019-01-28 ENCOUNTER — Encounter: Payer: Self-pay | Admitting: *Deleted

## 2019-01-28 NOTE — Progress Notes (Signed)
Per ASCCP guidelines patient will need next pap in one year after biopsy with CIN1 results.  Letter mailed with appointment to follow up in [place] on 01/12/20 @ 10:30.

## 2019-04-22 ENCOUNTER — Ambulatory Visit (INDEPENDENT_AMBULATORY_CARE_PROVIDER_SITE_OTHER): Payer: Self-pay | Admitting: Obstetrics and Gynecology

## 2019-04-22 ENCOUNTER — Ambulatory Visit: Payer: Self-pay | Admitting: Obstetrics and Gynecology

## 2019-04-22 ENCOUNTER — Other Ambulatory Visit: Payer: Self-pay

## 2019-04-22 ENCOUNTER — Encounter: Payer: Self-pay | Admitting: Obstetrics and Gynecology

## 2019-04-22 VITALS — BP 131/79 | HR 70 | Ht 61.0 in | Wt 166.0 lb

## 2019-04-22 DIAGNOSIS — N870 Mild cervical dysplasia: Secondary | ICD-10-CM

## 2019-04-22 NOTE — Progress Notes (Signed)
HPI:      [name] is a 39 y.o. No obstetric history on file. who LMP was No LMP recorded. Patient has had an implant.  Subjective:   She presents today to discuss colposcopy results.  These 3 results revealed CIN-1 of the endocervical canal.  Colposcopy of the exocervix revealed no abnormalities.    Hx: The following portions of the patient's history were reviewed and updated as appropriate:             She  has a past medical history of Depression, GERD (gastroesophageal reflux disease), Headache(784.0), and Neuromuscular disorder (HCC). She does not have any pertinent problems on file. She  has a past surgical history that includes Cesarean section; Vaginal birth after Cesarean section (2012); Cholecystectomy (2010); and Lumbar laminectomy/decompression microdiscectomy (Left, 09/10/2013). Her family history is not on file. She  reports that she has quit smoking. She has never used smokeless tobacco. She reports that she does not drink alcohol or use drugs. She has a current medication list which includes the following prescription(s): famotidine, loratadine, menthol (topical analgesic), and prednisone. She has No Known Allergies.       Review of Systems:  Review of Systems  Constitutional: Denied constitutional symptoms, night sweats, recent illness, fatigue, fever, insomnia and weight loss.  Eyes: Denied eye symptoms, eye pain, photophobia, vision change and visual disturbance.  Ears/Nose/Throat/Neck: Denied ear, nose, throat or neck symptoms, hearing loss, nasal discharge, sinus congestion and sore throat.  Cardiovascular: Denied cardiovascular symptoms, arrhythmia, chest pain/pressure, edema, exercise intolerance, orthopnea and palpitations.  Respiratory: Denied pulmonary symptoms, asthma, pleuritic pain, productive sputum, cough, dyspnea and wheezing.  Gastrointestinal: Denied, gastro-esophageal reflux, melena, nausea and vomiting.  Genitourinary: Denied  genitourinary symptoms including symptomatic vaginal discharge, pelvic relaxation issues, and urinary complaints.  Musculoskeletal: Denied musculoskeletal symptoms, stiffness, swelling, muscle weakness and myalgia.  Dermatologic: Denied dermatology symptoms, rash and scar.  Neurologic: Denied neurology symptoms, dizziness, headache, neck pain and syncope.  Psychiatric: Denied psychiatric symptoms, anxiety and depression.  Endocrine: Denied endocrine symptoms including hot flashes and night sweats.   Meds:   Current Outpatient Medications on File Prior to Visit  Medication Sig Dispense Refill  . famotidine (PEPCID) 20 MG tablet Take 1 tablet (20 mg total) by mouth 2 (two) times daily. (Patient not taking: Reported on 01/13/2019) 60 tablet 0  . loratadine (CLARITIN) 10 MG tablet TAKE 1 TABLET BY MOUTH ONCE DAILY FOR ALLERGIES    . Menthol, Topical Analgesic, (ICY HOT EX) Apply 1 patch topically daily as needed (for pain).    . predniSONE (DELTASONE) 10 MG tablet Take 6 tablets today, on day 2 take 5 tablets, day 3 take 4 tablets, day 4 take 3 tablets, day 5 take  2 tablets and 1 tablet the last day (Patient not taking: Reported on 01/13/2019) 21 tablet 0   No current facility-administered medications on file prior to visit.    Objective:     Vitals:   04/22/19 1441  BP: 131/79  Pulse: 70              CIN-1  Assessment:    No obstetric history on file. Patient Active Problem List   Diagnosis Date Noted  . HNP (herniated nucleus pulposus), lumbar 09/10/2013     1. CIN I (cervical intraepithelial neoplasia I)     Of the endocervical canal   Plan:            1.  Patient already has an appointment scheduled at  [facility] for follow-up Pap in October   2.  I have explained CIN-1 and its relationship to cervical cancer as well as HPV and possible different treatments for cervical dysplasia.  All of her questions were answered.  An interpreter was present throughout Orders No  orders of the defined types were placed in this encounter.   No orders of the defined types were placed in this encounter.     F/U  No follow-ups on file. I spent 21 minutes involved in the care of this patient preparing to see the patient by obtaining and reviewing her medical history (including labs, imaging tests and prior procedures), documenting clinical information in the electronic health record (EHR), counseling and coordinating care plans, writing and sending prescriptions, ordering tests or procedures and directly communicating with the patient by discussing pertinent items from her history and physical exam as well as detailing my assessment and plan as noted above so that she has an informed understanding.  All of her questions were answered.  Finis Bud, M.D. 04/22/2019 2:58 PM

## 2020-01-12 ENCOUNTER — Ambulatory Visit: Payer: Self-pay | Attending: Oncology

## 2021-12-26 ENCOUNTER — Ambulatory Visit (LOCAL_COMMUNITY_HEALTH_CENTER): Payer: Self-pay | Admitting: Nurse Practitioner

## 2021-12-26 ENCOUNTER — Ambulatory Visit: Payer: Self-pay

## 2021-12-26 DIAGNOSIS — Z3046 Encounter for surveillance of implantable subdermal contraceptive: Secondary | ICD-10-CM

## 2021-12-26 DIAGNOSIS — F32A Depression, unspecified: Secondary | ICD-10-CM

## 2021-12-26 DIAGNOSIS — Z3009 Encounter for other general counseling and advice on contraception: Secondary | ICD-10-CM

## 2021-12-26 DIAGNOSIS — Z01419 Encounter for gynecological examination (general) (routine) without abnormal findings: Secondary | ICD-10-CM

## 2021-12-26 DIAGNOSIS — Z113 Encounter for screening for infections with a predominantly sexual mode of transmission: Secondary | ICD-10-CM

## 2021-12-26 LAB — WET PREP FOR TRICH, YEAST, CLUE
Trichomonas Exam: NEGATIVE
Yeast Exam: NEGATIVE

## 2021-12-26 LAB — HM HIV SCREENING LAB: HM HIV Screening: NEGATIVE
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
## 2021-12-26 NOTE — Progress Notes (Signed)
Pt visit for Nexplanon, STI screening, removal and PE. Pt seen by FNP White. Spanish interpreter used throughout visit. Family planning packet given. PHQ-9 administered. Nexplanon removed by FNP White.

## 2021-12-26 NOTE — Progress Notes (Signed)
Nexplanon Removal ?Patient identified, informed consent performed, consent signed.   Appropriate time out taken. Nexplanon site identified.  Area prepped in usual sterile fashon. 3 ml of 1% lidocaine with Epinephrine was used to anesthetize the area at the distal end of the implant and along implant site. A small stab incision was made right beside the implant on the distal portion.  The Nexplanon rod was grasped using hemostats/manual and removed without difficulty.  There was minimal blood loss. There were no complications.  Steri-strips were applied over the small incision.  A pressure bandage was applied to reduce any bruising.  The patient tolerated the procedure well and was given post procedure instructions.   Tiffany Moro, FNP  ?

## 2021-12-27 LAB — HEMOGLOBIN A1C
Est. average glucose Bld gHb Est-mCnc: 123 mg/dL
Hgb A1c MFr Bld: 5.9 % — ABNORMAL HIGH (ref 4.8–5.6)

## 2021-12-30 LAB — IGP, APTIMA HPV
HPV Aptima: POSITIVE — AB
PAP Smear Comment: 0

## 2021-12-31 ENCOUNTER — Telehealth: Payer: Self-pay

## 2021-12-31 NOTE — Telephone Encounter (Signed)
Calling pt re 12/26/21 PAP: NIL HPV +  Refer to Dr. Amalia Hailey (previously following pt re pap/colpo in past); to determine if they recommend and will perform another colpo. Reference Lorella Nimrod, FNP, orders on 12/26/21 pap result.  Send PAP card.

## 2022-01-04 NOTE — Telephone Encounter (Signed)
Phone call to pt with Tiffany Castillo, [place] interpreter, to [phone].  Left message on voicemail that RN with [facility] is calling re pap smear results. Please call Obera Stauch at [phone].

## 2022-01-08 NOTE — Telephone Encounter (Signed)
Phone call to Tiffany Castillo with [facility] interpreter Rowland Lathe.  Discussed Pap results and recommendation for Tiffany Castillo to follow-up with Dr. Amalia Hailey that had seen her before for colposcopy.  Tiffany Castillo states she does not have any insurance, and it would be fine to go to Dr. Amalia Hailey again. Tiffany Castillo counseled that RN will send request through [place] program to see if she qualifies for service at no charge.  If any issues, will call Tiffany Castillo back. If eligible for [facility], someone will be contacting her to set up appt.  Follow-up/colpo referral requesting Dr. Amalia Hailey sent to [facility] on 01/08/22.

## 2022-01-16 ENCOUNTER — Ambulatory Visit: Payer: Self-pay | Admitting: Licensed Clinical Social Worker

## 2022-01-16 DIAGNOSIS — F331 Major depressive disorder, recurrent, moderate: Secondary | ICD-10-CM

## 2022-01-16 DIAGNOSIS — Z91410 Personal history of adult physical and sexual abuse: Secondary | ICD-10-CM
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
## 2022-01-30 ENCOUNTER — Ambulatory Visit: Payer: Self-pay | Admitting: Licensed Clinical Social Worker

## 2022-01-30 DIAGNOSIS — F331 Major depressive disorder, recurrent, moderate: Secondary | ICD-10-CM

## 2022-01-30 DIAGNOSIS — Z91410 Personal history of adult physical and sexual abuse: Secondary | ICD-10-CM
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
## 2022-02-13 ENCOUNTER — Ambulatory Visit: Payer: Self-pay | Admitting: Licensed Clinical Social Worker

## 2022-02-20 ENCOUNTER — Telehealth: Payer: Self-pay | Admitting: Licensed Clinical Social Worker

## 2022-02-20 NOTE — Telephone Encounter (Signed)
This message came from marlene [place] - "Selena Lesser, Mrs. Vertie Dibbern called saying that she couldn't come to the app. Her phone number is [phone]. Roddie Mc"

## 2022-02-22 ENCOUNTER — Other Ambulatory Visit: Payer: Self-pay

## 2022-02-22 DIAGNOSIS — Z1231 Encounter for screening mammogram for malignant neoplasm of breast: Secondary | ICD-10-CM

## 2022-02-26 ENCOUNTER — Ambulatory Visit
Admission: RE | Admit: 2022-02-26 | Discharge: 2022-02-26 | Disposition: A | Discharge status: Home / Self Care | Payer: Self-pay | Source: Ambulatory Visit | Attending: Obstetrics and Gynecology | Admitting: Obstetrics and Gynecology

## 2022-02-26 ENCOUNTER — Ambulatory Visit: Payer: Self-pay | Attending: Hematology and Oncology | Admitting: Hematology and Oncology

## 2022-02-26 VITALS — BP 126/69 | Wt 170.7 lb

## 2022-02-26 DIAGNOSIS — Z1231 Encounter for screening mammogram for malignant neoplasm of breast: Secondary | ICD-10-CM | POA: Insufficient documentation

## 2022-02-26 NOTE — Progress Notes (Signed)
[name] is a 41 y.o. female who presents to [facility] clinic today with no complaints.    Pap Smear: Pap not smear completed today. Last Pap smear was 10/23 at [facility] clinic and was abnormal - Negative/ HPV+ . Per patient has history of an abnormal Pap smear. Last Pap smear result is available in Epic.   Physical exam: Breasts Breasts symmetrical. No skin abnormalities bilateral breasts. No nipple retraction bilateral breasts. No nipple discharge bilateral breasts. No lymphadenopathy. No lumps palpated bilateral breasts.       Pelvic/Bimanual Pap is not indicated today    Smoking History: Patient has is a former smoker having quit in 2017 and was not referred to quit line.    Patient Navigation: Patient education provided. Access to services provided for patient through [facility] program. Tiffany Castillo interpreter provided. No transportation provided   Colorectal Cancer Screening: Per patient has never had colonoscopy completed No complaints today.    Breast and Cervical Cancer Risk Assessment: Patient does not have family history of breast cancer, known genetic mutations, or radiation treatment to the chest before age 96. Patient has history of cervical dysplasia, immunocompromised, or DES exposure in-utero.  Risk Assessment   No risk assessment data for the current encounter  Risk Scores       01/13/2019   Last edited by: Tiffany Presto, RN   5-year risk: 0.4 %   Lifetime risk: 8.6 %            A: [facility] exam without pap smear No complaints with benign exam today. She will be referred to [facility] for colposcopy regarding persistent HPV.   P: Referred [facility] to the [facility] for a screening mammogram. Appointment scheduled 02/26/22.  Tiffany Lux, NP 02/26/2022 9:52 AM

## 2022-02-26 NOTE — Patient Instructions (Signed)
Taught Tiffany Castillo about self breast awareness and gave educational materials to take home. Patient did not need a Pap smear today due to last Pap smear was in 12/2021 per patient.  Let her know [facility] will cover Pap smears every 3 years unless has a history of abnormal Pap smears. Referred patient to the [facility] for diagnostic mammogram. Appointment scheduled for 02/26/22. Patient aware of appointment and will be there. Let patient know will follow up with her within the next couple weeks with results. Tiffany Castillo verbalized understanding. We will refer to Gyn for colposcopy for persistent HPV.  Pascal Lux, NP 10:10 AM

## 2022-04-10 ENCOUNTER — Ambulatory Visit: Payer: Self-pay | Admitting: Obstetrics & Gynecology

## 2022-04-30 ENCOUNTER — Ambulatory Visit: Payer: Self-pay | Admitting: Obstetrics & Gynecology

## 2022-04-30 ENCOUNTER — Telehealth: Payer: Self-pay

## 2022-04-30 NOTE — Telephone Encounter (Signed)
Pt calling; has appt today at 3:15 but has started bleeding and needs to reschedule.

## 2022-05-01 ENCOUNTER — Telehealth: Payer: Self-pay | Admitting: Obstetrics & Gynecology

## 2022-05-01 NOTE — Telephone Encounter (Signed)
Patient has been rescheduled to 2/26

## 2022-05-01 NOTE — Telephone Encounter (Signed)
Reached out to pt via interpreter to reschedule 04/30/2022 colpo with Dr. Hulan Fray.  Was able to reschedule appt with Dr. Hulan Fray on 05/20/2022 at 10:55.

## 2022-05-20 ENCOUNTER — Ambulatory Visit: Payer: Self-pay | Admitting: Obstetrics & Gynecology

## 2022-05-20 ENCOUNTER — Telehealth: Payer: Self-pay | Admitting: Obstetrics & Gynecology

## 2022-05-20 NOTE — Telephone Encounter (Signed)
Reached out to pt via interpreter to reschedule procedure (colpo) that was scheduled on 05/20/2022 with Dr. Hulan Fray at 10:55.  Was able to reschedule pt for  06/06/2022 at 1:15 with Dr. Hulan Fray.

## 2022-06-06 ENCOUNTER — Ambulatory Visit: Payer: Self-pay | Admitting: Obstetrics & Gynecology

## 2022-06-06 ENCOUNTER — Telehealth: Payer: Self-pay | Admitting: Obstetrics & Gynecology

## 2022-06-06 NOTE — Telephone Encounter (Signed)
Reached out to pt via interpreter to reschedule procedure (colpo) appt. That was scheduled on 06/06/2022 at 1:15.  This appt was first scheduled for Jan. 17th, 2024.  Has been cancelled and rescheduled 3-4 times since then.  Left message via interpreter for pt to call back to reschedule.

## 2022-06-07 ENCOUNTER — Encounter: Payer: Self-pay | Admitting: Obstetrics & Gynecology

## 2022-06-07 NOTE — Telephone Encounter (Signed)
Reached out to pt (2x) via interpreter to reschedule procedure (colpo) appt. That was scheduled on 06/06/2022 at 1:15.  This appt was first scheduled on Jan. 17, 2024.  Has been cancelled 3 times and No Showed 1 time.  Left message via interpreter for pt to call back to reschedule.  Will send a MyChart letter.

## 2022-07-05 NOTE — Telephone Encounter (Signed)
Pt has rescheduled several appts with [facility] and did not keep or reschedule the last scheduled appt on 06/06/22. Mailed certified letter to pt regarding the matter on 07/05/22. See scanned copy.
